# Patient Record
Sex: Female | Born: 1980 | Race: White | Hispanic: No | Marital: Married | Smoking: Current every day smoker
Health system: Southern US, Community
[De-identification: ages and names within clinical notes are randomized; demographics above are authoritative.]

## PROBLEM LIST (undated history)

## (undated) DIAGNOSIS — M25539 Pain in unspecified wrist: Secondary | ICD-10-CM

## (undated) DIAGNOSIS — F419 Anxiety disorder, unspecified: Secondary | ICD-10-CM

## (undated) DIAGNOSIS — E669 Obesity, unspecified: Secondary | ICD-10-CM

## (undated) DIAGNOSIS — I6529 Occlusion and stenosis of unspecified carotid artery: Secondary | ICD-10-CM

## (undated) HISTORY — DX: Pain in unspecified wrist: M25.539

## (undated) HISTORY — PX: TONSILLECTOMY: SUR1361

## (undated) HISTORY — DX: Occlusion and stenosis of unspecified carotid artery: I65.29

## (undated) HISTORY — PX: LAPAROSCOPIC APPENDECTOMY: SUR753

## (undated) HISTORY — PX: KNEE ARTHROSCOPY: SHX127

## (undated) HISTORY — PX: APPENDECTOMY: SHX54

## (undated) HISTORY — PX: KNEE SURGERY: SHX244

## (undated) HISTORY — DX: Obesity, unspecified: E66.9

## (undated) HISTORY — PX: ELBOW SURGERY: SHX618

---
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
## 2006-03-06 HISTORY — PX: FRACTURE SURGERY: SHX138

## 2008-10-08 ENCOUNTER — Emergency Department: Payer: Self-pay | Admitting: Emergency Medicine

## 2009-05-24 ENCOUNTER — Emergency Department: Payer: Self-pay | Admitting: Emergency Medicine

## 2009-06-18 ENCOUNTER — Emergency Department: Payer: Self-pay | Admitting: Unknown Physician Specialty

## 2009-06-22 ENCOUNTER — Ambulatory Visit: Payer: Self-pay | Admitting: Unknown Physician Specialty

## 2009-08-31 ENCOUNTER — Inpatient Hospital Stay: Payer: Self-pay | Admitting: Internal Medicine

## 2009-09-20 ENCOUNTER — Encounter: Payer: Self-pay | Admitting: Obstetrics & Gynecology

## 2009-09-23 ENCOUNTER — Other Ambulatory Visit: Payer: Self-pay | Admitting: Obstetrics & Gynecology

## 2010-06-26 ENCOUNTER — Observation Stay: Payer: Self-pay | Admitting: Obstetrics and Gynecology

## 2010-07-23 ENCOUNTER — Observation Stay: Payer: Self-pay

## 2010-09-22 ENCOUNTER — Observation Stay: Payer: Self-pay | Admitting: Obstetrics and Gynecology

## 2010-10-10 ENCOUNTER — Observation Stay: Payer: Self-pay

## 2010-10-19 ENCOUNTER — Observation Stay: Payer: Self-pay | Admitting: Obstetrics and Gynecology

## 2010-11-04 ENCOUNTER — Observation Stay: Payer: Self-pay | Admitting: Obstetrics and Gynecology

## 2010-11-09 ENCOUNTER — Inpatient Hospital Stay: Payer: Self-pay

## 2011-01-10 ENCOUNTER — Ambulatory Visit: Payer: Self-pay | Admitting: Obstetrics and Gynecology

## 2011-01-13 ENCOUNTER — Ambulatory Visit: Payer: Self-pay | Admitting: Obstetrics and Gynecology

## 2011-01-13 HISTORY — PX: DILATION AND CURETTAGE OF UTERUS: SHX78

## 2012-03-06 HISTORY — PX: CHOLECYSTECTOMY: SHX55

## 2012-08-07 ENCOUNTER — Emergency Department: Payer: Self-pay | Admitting: Emergency Medicine

## 2012-08-07 LAB — CBC WITH DIFFERENTIAL/PLATELET
Basophil #: 0.1 10*3/uL (ref 0.0–0.1)
Basophil %: 0.6 %
Eosinophil #: 0.1 10*3/uL (ref 0.0–0.7)
HGB: 14.2 g/dL (ref 12.0–16.0)
Lymphocyte #: 2.8 10*3/uL (ref 1.0–3.6)
Lymphocyte %: 29.8 %
MCH: 30.1 pg (ref 26.0–34.0)
MCV: 89 fL (ref 80–100)
Monocyte #: 0.7 x10 3/mm (ref 0.2–0.9)
Neutrophil #: 5.8 10*3/uL (ref 1.4–6.5)
Neutrophil %: 61.5 %
Platelet: 256 10*3/uL (ref 150–440)
WBC: 9.5 10*3/uL (ref 3.6–11.0)

## 2012-08-07 LAB — URINALYSIS, COMPLETE
Bilirubin,UR: NEGATIVE
Ketone: NEGATIVE
Nitrite: NEGATIVE
Ph: 6 (ref 4.5–8.0)
Protein: NEGATIVE
RBC,UR: 2 /HPF (ref 0–5)
Specific Gravity: 1.014 (ref 1.003–1.030)
Squamous Epithelial: 9
WBC UR: 15 /HPF (ref 0–5)

## 2012-08-07 LAB — BASIC METABOLIC PANEL
Calcium, Total: 9.4 mg/dL (ref 8.5–10.1)
Chloride: 109 mmol/L — ABNORMAL HIGH (ref 98–107)
Co2: 25 mmol/L (ref 21–32)
Creatinine: 0.58 mg/dL — ABNORMAL LOW (ref 0.60–1.30)
EGFR (Non-African Amer.): >60
Glucose: 90 mg/dL (ref 65–99)
Potassium: 3.6 mmol/L (ref 3.5–5.1)

## 2012-08-10 LAB — BETA STREP CULTURE(ARMC)

## 2012-10-25 ENCOUNTER — Emergency Department: Payer: Self-pay | Admitting: Emergency Medicine

## 2012-10-25 LAB — CBC WITH DIFFERENTIAL/PLATELET
Eosinophil #: 0.1 10*3/uL (ref 0.0–0.7)
Eosinophil %: 1.1 %
HCT: 42 % (ref 35.0–47.0)
Lymphocyte #: 3.7 10*3/uL — ABNORMAL HIGH (ref 1.0–3.6)
MCH: 30.5 pg (ref 26.0–34.0)
MCHC: 34.5 g/dL (ref 32.0–36.0)
MCV: 88 fL (ref 80–100)
Monocyte #: 0.4 x10 3/mm (ref 0.2–0.9)
Monocyte %: 4.1 %
RBC: 4.76 10*6/uL (ref 3.80–5.20)
RDW: 13.2 % (ref 11.5–14.5)
WBC: 10.9 10*3/uL (ref 3.6–11.0)

## 2012-10-25 LAB — COMPREHENSIVE METABOLIC PANEL
Albumin: 3.5 g/dL (ref 3.4–5.0)
Anion Gap: 5 — ABNORMAL LOW (ref 7–16)
Bilirubin,Total: 0.2 mg/dL (ref 0.2–1.0)
Calcium, Total: 9.1 mg/dL (ref 8.5–10.1)
Co2: 26 mmol/L (ref 21–32)
Creatinine: 0.65 mg/dL (ref 0.60–1.30)
EGFR (Non-African Amer.): >60
Glucose: 115 mg/dL — ABNORMAL HIGH (ref 65–99)
Osmolality: 272 (ref 275–301)
Potassium: 3.4 mmol/L — ABNORMAL LOW (ref 3.5–5.1)
SGOT(AST): 22 U/L (ref 15–37)
SGPT (ALT): 26 U/L (ref 12–78)
Sodium: 137 mmol/L (ref 136–145)
Total Protein: 7.6 g/dL (ref 6.4–8.2)

## 2012-10-25 LAB — URINALYSIS, COMPLETE
Bilirubin,UR: NEGATIVE
Ketone: NEGATIVE
Nitrite: NEGATIVE
Protein: NEGATIVE
RBC,UR: 3 /HPF (ref 0–5)
Specific Gravity: 1.019 (ref 1.003–1.030)

## 2012-10-25 LAB — LIPASE, BLOOD: Lipase: 118 U/L (ref 73–393)

## 2012-11-05 ENCOUNTER — Other Ambulatory Visit: Payer: Self-pay | Admitting: Surgery

## 2012-11-05 LAB — HCG, QUANTITATIVE, PREGNANCY: Beta Hcg, Quant.: <1 m[IU]/mL — ABNORMAL LOW

## 2012-11-06 ENCOUNTER — Ambulatory Visit: Payer: Self-pay | Admitting: Surgery

## 2012-11-11 ENCOUNTER — Ambulatory Visit: Payer: Self-pay | Admitting: Surgery

## 2012-11-11 LAB — CBC WITH DIFFERENTIAL/PLATELET
Eosinophil %: 0.7 %
Lymphocyte %: 25.4 %
MCH: 30.1 pg (ref 26.0–34.0)
MCHC: 33.9 g/dL (ref 32.0–36.0)
MCV: 89 fL (ref 80–100)
Neutrophil %: 68.4 %
Platelet: 257 10*3/uL (ref 150–440)
RBC: 4.56 10*6/uL (ref 3.80–5.20)
WBC: 8.4 10*3/uL (ref 3.6–11.0)

## 2012-11-11 LAB — BASIC METABOLIC PANEL
Anion Gap: 11 (ref 7–16)
BUN: 6 mg/dL — ABNORMAL LOW (ref 7–18)
Calcium, Total: 8.7 mg/dL (ref 8.5–10.1)
Creatinine: 0.65 mg/dL (ref 0.60–1.30)
EGFR (African American): >60
EGFR (Non-African Amer.): >60
Glucose: 105 mg/dL — ABNORMAL HIGH (ref 65–99)
Potassium: 3.8 mmol/L (ref 3.5–5.1)
Sodium: 140 mmol/L (ref 136–145)

## 2012-11-13 ENCOUNTER — Ambulatory Visit: Payer: Self-pay | Admitting: Surgery

## 2012-11-13 LAB — HEPATIC FUNCTION PANEL A (ARMC)
Albumin: 3.2 g/dL — ABNORMAL LOW (ref 3.4–5.0)
Alkaline Phosphatase: 108 U/L (ref 50–136)
Bilirubin, Direct: <0.1 mg/dL (ref 0.00–0.20)
SGOT(AST): 23 U/L (ref 15–37)
SGPT (ALT): 23 U/L (ref 12–78)
Total Protein: 7 g/dL (ref 6.4–8.2)

## 2012-12-10 ENCOUNTER — Emergency Department: Payer: Self-pay | Admitting: Emergency Medicine

## 2012-12-10 LAB — CBC
HGB: 13.9 g/dL (ref 12.0–16.0)
MCH: 30.3 pg (ref 26.0–34.0)
MCHC: 34 g/dL (ref 32.0–36.0)
MCV: 89 fL (ref 80–100)
Platelet: 244 10*3/uL (ref 150–440)
WBC: 12 10*3/uL — ABNORMAL HIGH (ref 3.6–11.0)

## 2012-12-10 LAB — COMPREHENSIVE METABOLIC PANEL
Albumin: 3.5 g/dL (ref 3.4–5.0)
Alkaline Phosphatase: 113 U/L (ref 50–136)
Anion Gap: 6 — ABNORMAL LOW (ref 7–16)
Calcium, Total: 8.8 mg/dL (ref 8.5–10.1)
Chloride: 109 mmol/L — ABNORMAL HIGH (ref 98–107)
EGFR (African American): >60
EGFR (Non-African Amer.): >60
SGPT (ALT): 32 U/L (ref 12–78)

## 2012-12-10 LAB — TROPONIN I: Troponin-I: <0.02 ng/mL

## 2012-12-13 ENCOUNTER — Encounter: Payer: Self-pay | Admitting: *Deleted

## 2012-12-16 ENCOUNTER — Encounter (INDEPENDENT_AMBULATORY_CARE_PROVIDER_SITE_OTHER): Payer: Self-pay

## 2012-12-16 ENCOUNTER — Ambulatory Visit (INDEPENDENT_AMBULATORY_CARE_PROVIDER_SITE_OTHER): Payer: BC Managed Care – PPO | Admitting: Cardiovascular Disease

## 2012-12-16 ENCOUNTER — Encounter: Payer: Self-pay | Admitting: Cardiovascular Disease

## 2012-12-16 VITALS — BP 132/90 | HR 92 | Ht 71.0 in | Wt 292.8 lb

## 2012-12-16 DIAGNOSIS — R079 Chest pain, unspecified: Secondary | ICD-10-CM | POA: Insufficient documentation

## 2012-12-16 DIAGNOSIS — E669 Obesity, unspecified: Secondary | ICD-10-CM | POA: Insufficient documentation

## 2012-12-16 DIAGNOSIS — Z8249 Family history of ischemic heart disease and other diseases of the circulatory system: Secondary | ICD-10-CM

## 2012-12-16 MED ORDER — NITROGLYCERIN 0.4 MG SL SUBL: 0.4000 mg | SUBLINGUAL_TABLET | SUBLINGUAL | Status: DC | PRN

## 2012-12-16 NOTE — Patient Instructions (Addendum)
We will schedule you for a treadmill stress test for chest pain Try coke or ginger ale for chest pain sx, also with pepcid and tums  If you have continue symptoms, take omeprazole daily for heart burn  Take nitro SL for severe pain as needed  Please call us if you have new issues that need to be addressed before your next appt.

## 2012-12-16 NOTE — Assessment & Plan Note (Signed)
Prior stress test in 2011 showing no ischemia. Episode of chest pain last week with negative workup in the hospital. No further episodes since that time. We will schedule her for a routine treadmill study to rule out ischemia. Minimal risk factors at this time. She's not a diabetic, nonsmoker (minimal smoking). Benign clinical exam. We have recommended she try carbonation for possible hiatal hernia or esophageal spasm. We've also given her nitroglycerin sublingual to take when necessary for recurrent chest pain symptoms. We have asked her to call our office for recurrent symptoms.

## 2012-12-16 NOTE — Progress Notes (Signed)
Patient ID: Monica Mann, female    DOB: August 19, 1980, 32 y.o.   MRN: [id number]  HPI Comments: [name] is a 32 year old woman with a very light smoking history, no diabetes, strong family history of CAD with mother who had cardiac issues in her 30s who had an episode of chest pain last week on 12/10/2012. She presents for new patient evaluation.  She reports that on October 7, 10:45 in the morning she had acute onset of left-sided chest pain. She was not particularly active at the time, was taking care of her daughter at home. She described it as a "boot stepping on her chest". Symptoms lasted for 15 minutes, were severe.  She was on the phone at the time with her husband. He called 911 .  She went to the hospital and they're EKG was essentially normal, potassium 3.4, negative troponin. She was discharged home with followup today  She does report having severe GERD in the last several days. The morning of her symptoms, she had eaten a banana . He tried to belch at times relieve her symptoms but this did not work she is otherwise active, takes care of 2 children and lead a busy lifestyle. In the hospital c Doppler showed no DVT, chest x-ray was normal  She had her gallbladder removed 11/13/2012 laparoscopically  Prior nuclear stress test in June 2011 that showed no ischemia Prior echocardiogram June 2009 that was essentially normal with normal ejection fraction  EKG shows normal sinus rhythm with rate 92 beats per minute, low voltage throughout     Outpatient Encounter Prescriptions as of 12/16/2012  Medication Sig Dispense Refill  . acetaminophen (TYLENOL) 325 MG tablet Take 650 mg by mouth every 6 (six) hours as needed for pain.      . Multiple Vitamin (MULTIVITAMIN) tablet Take 1 tablet by mouth daily.        Review of Systems  Constitutional: Negative.   HENT: Negative.   Eyes: Negative.   Respiratory: Negative.   Cardiovascular: Positive for chest pain.  Gastrointestinal:  Negative.   Endocrine: Negative.   Musculoskeletal: Negative.   Skin: Negative.   Allergic/Immunologic: Negative.   Neurological: Negative.   Hematological: Negative.   Psychiatric/Behavioral: Negative.   All other systems reviewed and are negative.    BP 132/90  Pulse 92  Ht 5\' 11"  (1.803 m)  Wt 292 lb 12 oz (132.791 kg)  BMI 40.85 kg/m2  Physical Exam  Nursing note and vitals reviewed. Constitutional: She is oriented to person, place, and time. She appears well-developed and well-nourished.  HENT:  Head: Normocephalic.  Nose: Nose normal.  Mouth/Throat: Oropharynx is clear and moist.  Eyes: Conjunctivae are normal. Pupils are equal, round, and reactive to light.  Neck: Normal range of motion. Neck supple. No JVD present.  Cardiovascular: Normal rate, regular rhythm, S1 normal, S2 normal, normal heart sounds and intact distal pulses.  Exam reveals no gallop and no friction rub.   No murmur heard. Pulmonary/Chest: Effort normal and breath sounds normal. No respiratory distress. She has no wheezes. She has no rales. She exhibits no tenderness.  Abdominal: Soft. Bowel sounds are normal. She exhibits no distension. There is no tenderness.  Musculoskeletal: Normal range of motion. She exhibits no edema and no tenderness.  Lymphadenopathy:    She has no cervical adenopathy.  Neurological: She is alert and oriented to person, place, and time. Coordination normal.  Skin: Skin is warm and dry. No rash noted. No erythema.  Psychiatric: She has  a normal mood and affect. Her behavior is normal. Judgment and thought content normal.    Assessment and Plan

## 2012-12-16 NOTE — Assessment & Plan Note (Signed)
Details of her mother's history are unavailable. Patient does not have diabetes, minimal smoking, cholesterol is unavailable

## 2012-12-16 NOTE — Assessment & Plan Note (Signed)
We have encouraged continued exercise, careful diet management in an effort to lose weight. 

## 2012-12-17 ENCOUNTER — Encounter: Payer: BC Managed Care – PPO | Admitting: Cardiovascular Disease

## 2012-12-18 ENCOUNTER — Encounter: Payer: Self-pay | Admitting: Cardiovascular Disease

## 2012-12-18 ENCOUNTER — Ambulatory Visit (INDEPENDENT_AMBULATORY_CARE_PROVIDER_SITE_OTHER): Payer: BC Managed Care – PPO | Admitting: Cardiovascular Disease

## 2012-12-18 VITALS — BP 118/80 | HR 88 | Ht 71.0 in | Wt 291.2 lb

## 2012-12-18 DIAGNOSIS — R079 Chest pain, unspecified: Secondary | ICD-10-CM

## 2012-12-18 NOTE — Patient Instructions (Addendum)
You are doing well. No medication changes were made.  Your stress test was normal.  No further testing needed.  Track your hdeart rate at hpome while at rest Call the office if it runs fast  Please call us if you have new issues that need to be addressed before your next appt.

## 2012-12-18 NOTE — Procedures (Signed)
Exercise Treadmill Test Treadmill ordered for recent epsiodes of chest pain.  Resting EKG shows NSR with rate of 84 bpm, no significant St or T wave changes Resting blood pressure of 118/80. Stand bruce protocal was used.  Patient exercised for 6 min 15 sec,  Peak heart rate of 179 bpm.  This was 95% of the maximum predicted heart rate (target heart rate 100). Achieved 7 METS No symptoms of chest pain or lightheadedness were reported at peak stress or in recovery.  Peak Blood pressure recorded was 162/78 Heart rate at 3 minutes in recovery was 126 bpm. No St changes concerning for ischemia.  FINAL IMPRESSION: Normal exercise stress test. No significant EKG changes concerning for ischemia. Good exercise tolerance. Recent chest pain likely from non-cardiac etiology

## 2012-12-31 ENCOUNTER — Encounter: Payer: Self-pay | Admitting: Family Medicine

## 2013-03-31 ENCOUNTER — Ambulatory Visit: Payer: Self-pay | Admitting: Family Medicine

## 2013-03-31 LAB — HCG, QUANTITATIVE, PREGNANCY: Beta Hcg, Quant.: <1 m[IU]/mL — ABNORMAL LOW

## 2013-09-24 ENCOUNTER — Ambulatory Visit: Payer: Self-pay | Admitting: Nurse Practitioner

## 2013-10-02 ENCOUNTER — Ambulatory Visit: Payer: Self-pay | Admitting: Oncology

## 2014-01-17 ENCOUNTER — Emergency Department: Payer: Self-pay | Admitting: Emergency Medicine

## 2014-06-26 NOTE — Op Note (Signed)
PATIENT NAME:  Monica Mann, Monica Mann MR#:  [id number] DATE OF BIRTH:  05-24-1980  DATE OF PROCEDURE:  11/13/2012  PREOPERATIVE DIAGNOSIS:  Biliary dyskinesia.   POSTOPERATIVE DIAGNOSIS:  Biliary dyskinesia.  PROCEDURE:  Laparoscopic cholecystectomy.   SURGEON:  Phoebe Perch, MD.   ANESTHESIA:  General with endotracheal tube.   INDICATIONS:  This is a patient with recurrent epigastric and right upper quadrant pain, associated fatty food intolerance. On workup, failed to show gallstones but showed biliary dyskinesia with a low ejection fraction and reproduction of her pain on CCK-induced HIDA scan.   Preoperatively, we discussed the rationale for surgery, the options of observation and the potential for this not resolving all of her symptoms and the risks of bleeding, infection, recurrence of symptoms, bile duct damage, bile duct leak, bowel injury, any of which could require further surgery and/or ERCP, stent and papillotomy. This was all reviewed for she and her husband in the preop holding area. They understood and agreed to proceed.   FINDINGS:  Fine granular sludge-like material in the bile, small cystic duct and extensive adhesions to the anterior surface of the gallbladder.   DESCRIPTION OF PROCEDURE:  The patient was induced with general anesthesia, given IV antibiotics. VT prophylaxis was in place. She was prepped and draped in sterile fashion. Marcaine was infiltrated. The skin and subcutaneous tissues around the periumbilical area, an incision was made. Veress needle was placed. Pneumoperitoneum was obtained and a 5 mm trocar port was placed. The abdominal cavity was explored and under direct vision, a 10 mm epigastric port and 2 lateral 5 mm ports were placed. The gallbladder was placed on tension. Extensive adhesions were taken down bluntly without the use of energy. The peritoneum over the infundibulum was incised bluntly and the cystic duct, gallbladder junction was well identified. The  cystic artery was well identified, doubly clipped and divided. This allowed for good visualization of the cystic duct as it entered the infundibulum. Here, it was doubly clipped and divided, and the gallbladder was taken in the gallbladder fossa with electrocautery and passed out through the epigastric port site with the aid of an Endo Catch bag. The area was checked for hemostasis, found to be adequate. It was irrigated with copious amounts of normal saline. There was no sign of bile leak, bowel injury or bleeding. Therefore, the camera was placed in the epigastric site to view back the periumbilical site. There were no sign of adhesions or bowel injury; therefore, pneumoperitoneum was released. All ports were removed. Fascial edges at the epigastric site were approximated with 0 Vicryl. Figure-of-eight sutures, 4-0 subcuticular Monocryl was used at all skin edges. Steri-Strips, Mastisol and sterile dressings were placed.  The patient tolerated the procedure well. There were no complications. She was taken to the recovery room in stable condition to be discharged in the care of her family.   FOLLOWUP:  In 10 days.   ____________________________ Jerrol Banana Burt Knack, MD rec:ce D: 11/13/2012 16:50:11 ET T: 11/13/2012 17:58:20 ET JOB#: [id number]  cc: Jerrol Banana. Burt Knack, MD, <Dictator> Florene Glen MD ELECTRONICALLY SIGNED 11/15/2012 19:13

## 2014-06-26 NOTE — H&P (Signed)
PATIENT NAME:  Monica Mann, Monica Mann MR#:  [id number] DATE OF BIRTH:  16-Jul-1980  DATE OF ADMISSION:  11/11/2012  DATE OF OPERATION: 11/13/2012  CHIEF COMPLAINT: Right upper quadrant pain.   HISTORY OF PRESENT ILLNESS: This is a patient with fairly classic symptoms of gallbladder disease with right upper quadrant pain associated with fatty food intolerance occurring near daily with nausea, occasional emesis, no fevers, no chills. The pain lasts approximately 2 to 3 hours and then recurs after another meal. She has lost 10 pounds. She was seen by Dr. Pat Patrick, who believed that she had gallbladder disease, but there was no objective evidence to confirm; therefore, he did a HIDA scan, which reproduced the patient's symptoms and showed an ejection fraction of 15%. She is here for elective laparoscopic cholecystectomy.   PAST MEDICAL HISTORY: None.   PAST SURGICAL HISTORY: Laparoscopic appendectomy for ruptured appendix.   ALLERGIES: None.   MEDICATIONS: Percocet.   FAMILY HISTORY: Noncontributory.   SOCIAL HISTORY: The patient does not smoke or drink.  REVIEW OF SYSTEMS: 10 system review was performed and negative with the exception of that mentioned in the history of present illness.   PHYSICAL EXAMINATION:  GENERAL: Morbidly obese female patient.  HEENT: No scleral icterus.  NECK: No palpable neck nodes.  CHEST: Clear to auscultation.  CARDIAC: Regular rate and rhythm.  ABDOMEN: Soft. There is tenderness in the right upper quadrant. There is a periumbilical infraumbilical scar.  INTEGUMENT: No jaundice.  NEUROLOGIC: Grossly intact. Calves are nontender.   A HIDA scan showed an ejection fraction of 15%.   LABORATORY VALUES: Personally reviewed showing no sign of choledocholithiasis and a normal hemoglobin and hematocrit.   ASSESSMENT AND PLAN: This is a patient with biliary dyskinesia. She is here for elective laparoscopic cholecystectomy. I have discussed with her the rationale for offering  this surgery, the risks of not resolving all of her symptoms including some of her symptoms that may be reflux-related, but are currently not being resolved by PPIs, the risks of bleeding, infection, conversion to an open procedure, bowel injury, bile duct injury, bile duct leak, any of which could require further surgery and/or ERCP, stent, and papillotomy. This has all been reviewed for her and a prescription for Percocet was given for postoperative pain control.   ____________________________ Jerrol Banana. Burt Knack, MD rec:aw D: 11/11/2012 11:32:50 ET T: 11/11/2012 12:02:17 ET JOB#: [id number]  cc: Jerrol Banana. Burt Knack, MD, <Dictator> Florene Glen MD ELECTRONICALLY SIGNED 11/13/2012 14:37

## 2014-09-05 ENCOUNTER — Emergency Department: Payer: BLUE CROSS/BLUE SHIELD

## 2014-09-05 ENCOUNTER — Emergency Department
Admission: EM | Admit: 2014-09-05 | Discharge: 2014-09-05 | Disposition: A | Discharge status: Home / Self Care | Payer: BLUE CROSS/BLUE SHIELD | Attending: Emergency Medicine | Admitting: Emergency Medicine

## 2014-09-05 DIAGNOSIS — Z79899 Other long term (current) drug therapy: Secondary | ICD-10-CM | POA: Insufficient documentation

## 2014-09-05 DIAGNOSIS — Y998 Other external cause status: Secondary | ICD-10-CM | POA: Diagnosis not present

## 2014-09-05 DIAGNOSIS — Z720 Tobacco use: Secondary | ICD-10-CM | POA: Insufficient documentation

## 2014-09-05 DIAGNOSIS — Y92009 Unspecified place in unspecified non-institutional (private) residence as the place of occurrence of the external cause: Secondary | ICD-10-CM | POA: Diagnosis not present

## 2014-09-05 DIAGNOSIS — S93402A Sprain of unspecified ligament of left ankle, initial encounter: Secondary | ICD-10-CM

## 2014-09-05 DIAGNOSIS — W19XXXA Unspecified fall, initial encounter: Secondary | ICD-10-CM | POA: Diagnosis not present

## 2014-09-05 DIAGNOSIS — Y939 Activity, unspecified: Secondary | ICD-10-CM | POA: Insufficient documentation

## 2014-09-05 DIAGNOSIS — Z880 Allergy status to penicillin: Secondary | ICD-10-CM | POA: Insufficient documentation

## 2014-09-05 DIAGNOSIS — S99912A Unspecified injury of left ankle, initial encounter: Secondary | ICD-10-CM | POA: Diagnosis present

## 2014-09-05 MED ORDER — IBUPROFEN 800 MG PO TABS
ORAL_TABLET | ORAL | Status: AC
  Filled 2014-09-05: qty 1

## 2014-09-05 MED ORDER — TRAMADOL HCL 50 MG PO TABS: 50.0000 mg | ORAL_TABLET | Freq: Four times a day (QID) | ORAL | Status: DC | PRN

## 2014-09-05 MED ORDER — IBUPROFEN 800 MG PO TABS
800.0000 mg | ORAL_TABLET | Freq: Three times a day (TID) | ORAL | Status: DC | PRN
Start: 2014-09-05 — End: 2014-12-11

## 2014-09-05 MED ORDER — TRAMADOL HCL 50 MG PO TABS
50.0000 mg | ORAL_TABLET | Freq: Once | ORAL | Status: AC
  Administered 2014-09-05: 50 mg via ORAL

## 2014-09-05 MED ORDER — IBUPROFEN 800 MG PO TABS
800.0000 mg | ORAL_TABLET | Freq: Once | ORAL | Status: AC
  Administered 2014-09-05: 800 mg via ORAL

## 2014-09-05 MED ORDER — TRAMADOL HCL 50 MG PO TABS
ORAL_TABLET | ORAL | Status: AC
  Filled 2014-09-05: qty 1

## 2014-09-05 NOTE — Discharge Instructions (Signed)
Ankle Sprain  An ankle sprain is an injury to the strong, fibrous tissues (ligaments) that hold your ankle bones together.   HOME CARE   · Put ice on your ankle for 1-2 days or as told by your doctor.  ¨ Put ice in a plastic bag.  ¨ Place a towel between your skin and the bag.  ¨ Leave the ice on for 15-20 minutes at a time, every 2 hours while you are awake.  · Only take medicine as told by your doctor.  · Raise (elevate) your injured ankle above the level of your heart as much as possible for 2-3 days.  · Use crutches if your doctor tells you to. Slowly put your own weight on the affected ankle. Use the crutches until you can walk without pain.  · If you have a plaster splint:  ¨ Do not rest it on anything harder than a pillow for 24 hours.  ¨ Do not put weight on it.  ¨ Do not get it wet.  ¨ Take it off to shower or bathe.  · If given, use an elastic wrap or support stocking for support. Take the wrap off if your toes lose feeling (numb), tingle, or turn cold or blue.  · If you have an air splint:  ¨ Add or let out air to make it comfortable.  ¨ Take it off at night and to shower and bathe.  ¨ Wiggle your toes and move your ankle up and down often while you are wearing it.  GET HELP IF:  · You have rapidly increasing bruising or puffiness (swelling).  · Your toes feel very cold.  · You lose feeling in your foot.  · Your medicine does not help your pain.  GET HELP RIGHT AWAY IF:   · Your toes lose feeling (numb) or turn blue.  · You have severe pain that is increasing.  MAKE SURE YOU:   · Understand these instructions.  · Will watch your condition.  · Will get help right away if you are not doing well or get worse.  Document Released: 08/09/2007 Document Revised: 07/07/2013 Document Reviewed: 09/04/2011  [employer]® Patient Information ©2015 [employer]. This information is not intended to replace advice given to you by your health care provider. Make sure you discuss any questions you have with your health care  provider.

## 2014-09-05 NOTE — ED Notes (Addendum)
Pt reporting a fall lastnight on kitchen floor, heard a loud pop when ankle twisted and hit right shoulder on the floor. Pt applied ice and took Ibuprofen with minimal relief.

## 2014-09-05 NOTE — ED Notes (Signed)
Splint applied by Edison Nasuti. Tech. Circulation, color  WNL.

## 2014-09-05 NOTE — ED Provider Notes (Addendum)
[facility] Emergency Department Provider Note  ____________________________________________  Time seen: Approximately 3:00 PM  I have reviewed the triage vital signs and the nursing notes.   HISTORY  Chief Complaint Ankle Injury    HPI Monica Mann is a 34 y.o. female patient complaining of left ankle and foot pain secondary to a fall last night. He states "occurred at home she heard a loud popping sensation ankle. She states she has been applying ice and take Vicoprofen without any good relief. Patient states she's had pain with ambulation. Patient is rating the pain as a 5/10.   History reviewed. No pertinent past medical history.  Patient Active Problem List   Diagnosis Date Noted  . Chest pain 12/16/2012  . Family history of heart disease 12/16/2012  . Obesity 12/16/2012    Past Surgical History  Procedure Laterality Date  . Laparoscopic appendectomy    . Cholecystectomy  2014  . Knee surgery      left knee  . Elbow surgery      right    Current Outpatient Rx  Name  Route  Sig  Dispense  Refill  . acetaminophen (TYLENOL) 325 MG tablet   Oral   Take 650 mg by mouth every 6 (six) hours as needed for pain.         Marland Kitchen ibuprofen (ADVIL,MOTRIN) 800 MG tablet   Oral   Take 1 tablet (800 mg total) by mouth every 8 (eight) hours as needed for moderate pain.   15 tablet   0   . Multiple Vitamin (MULTIVITAMIN) tablet   Oral   Take 1 tablet by mouth daily.         . nitroGLYCERIN (NITROSTAT) 0.4 MG SL tablet   Sublingual   Place 1 tablet (0.4 mg total) under the tongue every 5 (five) minutes as needed for chest pain.   25 tablet   3   . traMADol (ULTRAM) 50 MG tablet   Oral   Take 1 tablet (50 mg total) by mouth every 6 (six) hours as needed for moderate pain.   12 tablet   0     Allergies Bee venom and Penicillins  Family History  Problem Relation Age of Onset  . Heart disease Mother     stent placement  . Heart  attack Mother 8    MI  . Hypertension Father   . Hyperlipidemia Father     Social History History  Substance Use Topics  . Smoking status: Current Some Day Smoker -- 0.25 packs/day for 5 years    Types: Cigarettes  . Smokeless tobacco: Not on file  . Alcohol Use: No    Review of Systems Constitutional: No fever/chills Eyes: No visual changes. ENT: No sore throat. Cardiovascular: Denies chest pain. Respiratory: Denies shortness of breath. Gastrointestinal: No abdominal pain.  No nausea, no vomiting.  No diarrhea.  No constipation. Genitourinary: Negative for dysuria. Musculoskeletal: Ankle and foot pain.. Skin: Negative for rash. Neurological: Negative for headaches, focal weakness or numbness. Allergic/Immunilogical: Bee stings and penicillin.  10-point ROS otherwise negative.  ____________________________________________   PHYSICAL EXAM:  VITAL SIGNS: ED Triage Vitals  Enc Vitals Group     BP 09/05/14 1255 126/90 mmHg     Pulse Rate 09/05/14 1255 102     Resp --      Temp 09/05/14 1255 98.8 F (37.1 C)     Temp Source 09/05/14 1255 Oral     SpO2 09/05/14 1255 97 %  Weight 09/05/14 1255 240 lb (108.863 kg)     Height 09/05/14 1255 5\' 11"  (1.803 m)     Head Cir --      Peak Flow --      Pain Score 09/05/14 1255 5     Pain Loc --      Pain Edu? --      Excl. in [place]? --     Constitutional: Alert and oriented. Well appearing and in no acute distress. Eyes: Conjunctivae are normal. PERRL. EOMI. Head: Atraumatic. Nose: No congestion/rhinnorhea. Mouth/Throat: Mucous membranes are moist.  Oropharynx non-erythematous. Neck: No stridor. No cervical spine tenderness to palpation Hematological/Lymphatic/Immunilogical: No cervical lymphadenopathy. **}Cardiovascular: Normal rate, regular rhythm. Grossly normal heart sounds.  Good peripheral circulation. Respiratory: Normal respiratory effort.  No retractions. Lungs CTAB. Gastrointestinal: Soft and nontender. No  distention. No abdominal bruits. No CVA tenderness. Musculoskeletal: Right shoulder pain and left foot and ankle pain. Neurologic:  Normal speech and language. No gross focal neurologic deficits are appreciated. Speech is normal. No gait instability. Skin:  Skin is warm, dry and intact. No rash noted. Psychiatric: Mood and affect are normal. Speech and behavior are normal.  ____________________________________________   LABS (all labs ordered are listed, but only abnormal results are displayed)  Labs Reviewed - No data to display ____________________________________________  EKG   ____________________________________________  RADIOLOGY  No acute findings. I, Sable Feil, personally viewed and evaluated these images as part of my medical decision making. I, Sable Feil, personally viewed and evaluated these images as part of my medical decision making.   ____________________________________________   PROCEDURES  Procedure(s) performed: None  Critical Care performed: No  ____________________________________________   INITIAL IMPRESSION / ASSESSMENT AND PLAN / ED COURSE  Pertinent labs & imaging results that were available during my care of the patient were reviewed by me and considered in my medical decision making (see chart for details).  Sprain left ankle and shoulder contusion. Patient placed in a Velcro ankle splint. Given instruction home care for ankle sprain. Patient does follow "clinic as needed. Patient had prescription for tramadol and ibuprofen. ____________________________________________   FINAL CLINICAL IMPRESSION(S) / ED DIAGNOSES  Final diagnoses:  Sprain of left ankle, initial encounter      Sable Feil, PA-C 09/05/14 1556  Orbie Pyo, MD 09/06/14 [place], PA-C 09/07/14 1841  Orbie Pyo, MD 09/11/14 [phone]

## 2014-11-14 ENCOUNTER — Emergency Department (HOSPITAL_BASED_OUTPATIENT_CLINIC_OR_DEPARTMENT_OTHER)
Admission: EM | Admit: 2014-11-14 | Discharge: 2014-11-15 | Disposition: A | Discharge status: Home / Self Care | Payer: BLUE CROSS/BLUE SHIELD | Attending: Emergency Medicine | Admitting: Emergency Medicine

## 2014-11-14 ENCOUNTER — Encounter (HOSPITAL_BASED_OUTPATIENT_CLINIC_OR_DEPARTMENT_OTHER): Payer: Self-pay | Admitting: Emergency Medicine

## 2014-11-14 DIAGNOSIS — R509 Fever, unspecified: Secondary | ICD-10-CM | POA: Diagnosis not present

## 2014-11-14 DIAGNOSIS — R102 Pelvic and perineal pain: Secondary | ICD-10-CM | POA: Diagnosis present

## 2014-11-14 DIAGNOSIS — Z720 Tobacco use: Secondary | ICD-10-CM | POA: Diagnosis not present

## 2014-11-14 DIAGNOSIS — Z3202 Encounter for pregnancy test, result negative: Secondary | ICD-10-CM | POA: Insufficient documentation

## 2014-11-14 DIAGNOSIS — Z880 Allergy status to penicillin: Secondary | ICD-10-CM | POA: Diagnosis not present

## 2014-11-14 LAB — URINALYSIS, ROUTINE W REFLEX MICROSCOPIC
BILIRUBIN URINE: NEGATIVE
Glucose, UA: NEGATIVE mg/dL
Hgb urine dipstick: NEGATIVE
Ketones, ur: NEGATIVE mg/dL
LEUKOCYTES UA: NEGATIVE
NITRITE: NEGATIVE
PH: 5.5 (ref 5.0–8.0)
Protein, ur: NEGATIVE mg/dL
SPECIFIC GRAVITY, URINE: 1.041 — AB (ref 1.005–1.030)
Urobilinogen, UA: 0.2 mg/dL (ref 0.0–1.0)

## 2014-11-14 LAB — PREGNANCY, URINE: PREG TEST UR: NEGATIVE

## 2014-11-14 NOTE — ED Notes (Signed)
Patient states that she has had her period of dark blood x 15 days. The patient reports 4 days of lower pelvic pain

## 2014-11-14 NOTE — ED Provider Notes (Signed)
CSN: [id number]     Arrival date & time 11/14/14  2218 History  This chart was scribed for Monica Wolin, MD by Meriel Pica, ED Scribe. This patient was seen in room MH09/MH09 and the patient's care was started 11:55 PM.   Chief Complaint  Patient presents with  . Pelvic Pain   Patient is a 34 y.o. female presenting with pelvic pain. The history is provided by the patient. No language interpreter was used.  Pelvic Pain This is a new problem. The current episode started more than 2 days ago (4 days). The problem occurs constantly. The problem has been gradually worsening. Pertinent negatives include no chest pain, no headaches and no shortness of breath. Nothing aggravates the symptoms. Nothing relieves the symptoms. She has tried nothing for the symptoms. The treatment provided no relief.   HPI Comments: Monica Mann is a 34 y.o. female who presents to the Emergency Department complaining of worsening, constant, severe, pelvic pain that radiates to bilateral lower back onset 4 days ago. Pt reports irregular, heavy, dark menses every 15 days and thought she was coming on her cycle again as the pain is similar but she has not. Last normal BM this morning. She notes intermittent fevers with last fever being yesterday at 102F. Pt has appointment with OB GYN. Denies dysuria, hematuria, abnormal vaginal discharge, nasal congestion, cough, itching or running eyes.   History reviewed. No pertinent past medical history. Past Surgical History  Procedure Laterality Date  . Laparoscopic appendectomy    . Cholecystectomy  2014  . Knee surgery      left knee  . Elbow surgery      right   Family History  Problem Relation Age of Onset  . Heart disease Mother     stent placement  . Heart attack Mother 46    MI  . Hypertension Father   . Hyperlipidemia Father    Social History  Substance Use Topics  . Smoking status: Current Some Day Smoker -- 0.25 packs/day for 5 years    Types: Cigarettes   . Smokeless tobacco: None  . Alcohol Use: No   OB History    No data available     Review of Systems  Constitutional: Positive for fever.  Eyes: Negative for discharge and itching.  Respiratory: Negative for cough and shortness of breath.   Cardiovascular: Negative for chest pain.  Gastrointestinal: Negative for diarrhea and constipation.  Genitourinary: Positive for pelvic pain. Negative for dysuria, urgency, hematuria, flank pain, decreased urine volume and vaginal discharge.  Neurological: Negative for headaches.  All other systems reviewed and are negative.  Allergies  Bee venom and Penicillins  Home Medications   Prior to Admission medications   Medication Sig Start Date End Date Taking? Authorizing Provider  acetaminophen (TYLENOL) 325 MG tablet Take 650 mg by mouth every 6 (six) hours as needed for pain.    Historical Provider, MD  ibuprofen (ADVIL,MOTRIN) 800 MG tablet Take 1 tablet (800 mg total) by mouth every 8 (eight) hours as needed for moderate pain. 09/05/14   Sable Feil, PA-C  Multiple Vitamin (MULTIVITAMIN) tablet Take 1 tablet by mouth daily.    Historical Provider, MD  nitroGLYCERIN (NITROSTAT) 0.4 MG SL tablet Place 1 tablet (0.4 mg total) under the tongue every 5 (five) minutes as needed for chest pain. 12/16/12   Minna Merritts, MD  traMADol (ULTRAM) 50 MG tablet Take 1 tablet (50 mg total) by mouth every 6 (six) hours as needed for  moderate pain. 09/05/14   Sable Feil, PA-C   BP 146/96 mmHg  Pulse 100  Temp(Src) 98 F (36.7 C) (Oral)  Resp 16  Ht 5\' 11"  (1.803 m)  Wt 254 lb (115.214 kg)  BMI 35.44 kg/m2  SpO2 100%  LMP 11/14/2014 Physical Exam  Constitutional: She is oriented to person, place, and time. She appears well-developed and well-nourished. No distress.  HENT:  Head: Normocephalic.  Mouth/Throat: Oropharynx is clear and moist.  Eyes: Conjunctivae are normal. Pupils are equal, round, and reactive to light. Right eye exhibits no  discharge. Left eye exhibits no discharge. No scleral icterus.  Neck: Normal range of motion. Neck supple. No JVD present.  Cardiovascular: Normal rate, regular rhythm, normal heart sounds and intact distal pulses.   Pulmonary/Chest: Effort normal and breath sounds normal. No respiratory distress. She has no wheezes. She has no rales.  Abdominal: Soft. She exhibits no distension and no mass. Bowel sounds are increased. There is no tenderness. There is no rigidity, no rebound, no guarding, no tenderness at McBurney's point and negative Murphy's sign.  Musculoskeletal: Normal range of motion.  Neurological: She is alert and oriented to person, place, and time. Coordination normal.  Skin: Skin is warm and dry. No rash noted. No erythema. No pallor.  Psychiatric: She has a normal mood and affect. Her behavior is normal.  Nursing note and vitals reviewed.   ED Course  Procedures  DIAGNOSTIC STUDIES: Oxygen Saturation is 100% on RA, normal by my interpretation.    COORDINATION OF CARE: 12:00 AM Discussed treatment plan which includes to order diagnostic labs with pt. Will order CT abdomen pelvis. Pt acknowledges and agrees to plan.   Labs Review Labs Reviewed  URINALYSIS, ROUTINE W REFLEX MICROSCOPIC (NOT AT [facility]) - Abnormal; Notable for the following:    APPearance CLOUDY (*)    Specific Gravity, Urine 1.041 (*)    All other components within normal limits  COMPREHENSIVE METABOLIC PANEL - Abnormal; Notable for the following:    CO2 21 (*)    Glucose, Bld 104 (*)    All other components within normal limits  CBC - Abnormal; Notable for the following:    WBC 19.1 (*)    All other components within normal limits  DIFFERENTIAL - Abnormal; Notable for the following:    Neutro Abs 14.3 (*)    All other components within normal limits  PREGNANCY, URINE  LIPASE, BLOOD    Imaging Review No results found. I have personally reviewed and evaluated these images and lab results as part of my  medical decision-making.   EKG Interpretation None      MDM   Final diagnoses:  None    Results for orders placed or performed during the hospital encounter of 11/14/14  Urinalysis, Routine w reflex microscopic (not at [facility])  Result Value Ref Range   Color, Urine YELLOW YELLOW   APPearance CLOUDY (A) CLEAR   Specific Gravity, Urine 1.041 (H) 1.005 - 1.030   pH 5.5 5.0 - 8.0   Glucose, UA NEGATIVE NEGATIVE mg/dL   Hgb urine dipstick NEGATIVE NEGATIVE   Bilirubin Urine NEGATIVE NEGATIVE   Ketones, ur NEGATIVE NEGATIVE mg/dL   Protein, ur NEGATIVE NEGATIVE mg/dL   Urobilinogen, UA 0.2 0.0 - 1.0 mg/dL   Nitrite NEGATIVE NEGATIVE   Leukocytes, UA NEGATIVE NEGATIVE  Pregnancy, urine  Result Value Ref Range   Preg Test, Ur NEGATIVE NEGATIVE  Lipase, blood  Result Value Ref Range   Lipase 22 22 -  51 U/L  Comprehensive metabolic panel  Result Value Ref Range   Sodium 137 135 - 145 mmol/L   Potassium 3.5 3.5 - 5.1 mmol/L   Chloride 106 101 - 111 mmol/L   CO2 21 (L) 22 - 32 mmol/L   Glucose, Bld 104 (H) 65 - 99 mg/dL   BUN 14 6 - 20 mg/dL   Creatinine, Ser 0.69 0.44 - 1.00 mg/dL   Calcium 9.4 8.9 - 10.3 mg/dL   Total Protein 7.8 6.5 - 8.1 g/dL   Albumin 4.3 3.5 - 5.0 g/dL   AST 22 15 - 41 U/L   ALT 23 14 - 54 U/L   Alkaline Phosphatase 71 38 - 126 U/L   Total Bilirubin 0.3 0.3 - 1.2 mg/dL   GFR calc non Af Amer >60 >60 mL/min   GFR calc Af Amer >60 >60 mL/min   Anion gap 10 5 - 15  CBC  Result Value Ref Range   WBC 19.1 (H) 4.0 - 10.5 K/uL   RBC 4.53 3.87 - 5.11 MIL/uL   Hemoglobin 13.9 12.0 - 15.0 g/dL   HCT 41.1 36.0 - 46.0 %   MCV 90.7 78.0 - 100.0 fL   MCH 30.7 26.0 - 34.0 pg   MCHC 33.8 30.0 - 36.0 g/dL   RDW 13.7 11.5 - 15.5 %   Platelets 265 150 - 400 K/uL  Differential  Result Value Ref Range   Neutrophils Relative % 75 43 - 77 %   Neutro Abs 14.3 (H) 1.7 - 7.7 K/uL   Lymphocytes Relative 19 12 - 46 %   Lymphs Abs 3.7 0.7 - 4.0 K/uL   Monocytes  Relative 5 3 - 12 %   Monocytes Absolute 1.0 0.1 - 1.0 K/uL   Eosinophils Relative 1 0 - 5 %   Eosinophils Absolute 0.1 0.0 - 0.7 K/uL   Basophils Relative 0 0 - 1 %   Basophils Absolute 0.0 0.0 - 0.1 K/uL   Ct Abdomen Pelvis W Contrast  11/15/2014   CLINICAL DATA:  34 year old female with mid abdominal and pelvic pain.  EXAM: CT ABDOMEN AND PELVIS WITH CONTRAST  TECHNIQUE: Multidetector CT imaging of the abdomen and pelvis was performed using the standard protocol following bolus administration of intravenous contrast.  CONTRAST:  148mL OMNIPAQUE IOHEXOL 300 MG/ML SOLN, 96mL OMNIPAQUE IOHEXOL 300 MG/ML SOLN  COMPARISON:  None.  FINDINGS: Minimal bibasilar dependent atelectatic changes. The visualized lung bases are clear. No intra-abdominal free air or free fluid.  Cholecystectomy. The liver, pancreas, spleen, adrenal glands, kidneys, visualized ureters, and urinary bladder appear unremarkable. The uterus is anteverted and appears unremarkable. There is a 3 cm dominant left ovarian follicle/cyst. Ultrasound may provide better evaluation of the pelvic structures.  Moderate stool throughout the colon no evidence of bowel obstruction or inflammation. Appendectomy.  The abdominal aorta and IVC appear unremarkable. No portal venous gas identified. The visualized osseous structures are unremarkable. No acute fracture.  IMPRESSION: A 3 cm dominant left ovarian follicle/ cyst. No other acute intra-abdominal or pelvic pathology identified.   Electronically Signed   By: Anner Crete M.D.   On: 11/15/2014 02:06    Medications  ondansetron (ZOFRAN) injection 4 mg (4 mg Intravenous Given 11/15/14 0049)  sodium chloride 0.9 % bolus 1,000 mL (0 mLs Intravenous Stopped 11/15/14 0312)  morphine 4 MG/ML injection 4 mg (4 mg Intravenous Given 11/15/14 0049)  iohexol (OMNIPAQUE) 300 MG/ML solution 25 mL (25 mLs Oral Contrast Given 11/15/14 0130)  iohexol (OMNIPAQUE)  300 MG/ML solution 100 mL (100 mLs Intravenous  Contrast Given 11/15/14 0130)  ketorolac (TORADOL) 30 MG/ML injection 30 mg (30 mg Intravenous Given 11/15/14 0242)   No acute findings in the abdomen or pelvis on CT scan.   Will treat wit Mobic and have patient follow up with GYN    I personally performed the services described in this documentation, which was scribed in my presence. The recorded information has been reviewed and is accurate.     Veatrice Kells, MD 11/15/14 [phone]

## 2014-11-15 ENCOUNTER — Encounter (HOSPITAL_BASED_OUTPATIENT_CLINIC_OR_DEPARTMENT_OTHER): Payer: Self-pay | Admitting: Emergency Medicine

## 2014-11-15 ENCOUNTER — Emergency Department (HOSPITAL_BASED_OUTPATIENT_CLINIC_OR_DEPARTMENT_OTHER): Payer: BLUE CROSS/BLUE SHIELD

## 2014-11-15 LAB — CBC
HCT: 41.1 % (ref 36.0–46.0)
HEMOGLOBIN: 13.9 g/dL (ref 12.0–15.0)
MCH: 30.7 pg (ref 26.0–34.0)
MCHC: 33.8 g/dL (ref 30.0–36.0)
MCV: 90.7 fL (ref 78.0–100.0)
Platelets: 265 10*3/uL (ref 150–400)
RBC: 4.53 MIL/uL (ref 3.87–5.11)
RDW: 13.7 % (ref 11.5–15.5)
WBC: 19.1 10*3/uL — ABNORMAL HIGH (ref 4.0–10.5)

## 2014-11-15 LAB — DIFFERENTIAL
BASOS ABS: 0 10*3/uL (ref 0.0–0.1)
BASOS PCT: 0 % (ref 0–1)
Eosinophils Absolute: 0.1 10*3/uL (ref 0.0–0.7)
Eosinophils Relative: 1 % (ref 0–5)
LYMPHS ABS: 3.7 10*3/uL (ref 0.7–4.0)
Lymphocytes Relative: 19 % (ref 12–46)
Monocytes Absolute: 1 10*3/uL (ref 0.1–1.0)
Monocytes Relative: 5 % (ref 3–12)
Neutro Abs: 14.3 10*3/uL — ABNORMAL HIGH (ref 1.7–7.7)
Neutrophils Relative %: 75 % (ref 43–77)

## 2014-11-15 LAB — COMPREHENSIVE METABOLIC PANEL
ALBUMIN: 4.3 g/dL (ref 3.5–5.0)
ALT: 23 U/L (ref 14–54)
ANION GAP: 10 (ref 5–15)
AST: 22 U/L (ref 15–41)
Alkaline Phosphatase: 71 U/L (ref 38–126)
BILIRUBIN TOTAL: 0.3 mg/dL (ref 0.3–1.2)
BUN: 14 mg/dL (ref 6–20)
CALCIUM: 9.4 mg/dL (ref 8.9–10.3)
CO2: 21 mmol/L — ABNORMAL LOW (ref 22–32)
Chloride: 106 mmol/L (ref 101–111)
Creatinine, Ser: 0.69 mg/dL (ref 0.44–1.00)
GFR calc non Af Amer: >60 mL/min (ref >60)
GLUCOSE: 104 mg/dL — AB (ref 65–99)
Potassium: 3.5 mmol/L (ref 3.5–5.1)
Sodium: 137 mmol/L (ref 135–145)
Total Protein: 7.8 g/dL (ref 6.5–8.1)

## 2014-11-15 LAB — LIPASE, BLOOD: Lipase: 22 U/L (ref 22–51)

## 2014-11-15 MED ORDER — ONDANSETRON HCL 4 MG/2ML IJ SOLN
4.0000 mg | Freq: Once | INTRAMUSCULAR | Status: AC
  Administered 2014-11-15: 4 mg via INTRAVENOUS
  Filled 2014-11-15: qty 2

## 2014-11-15 MED ORDER — IOHEXOL 300 MG/ML  SOLN
25.0000 mL | Freq: Once | INTRAMUSCULAR | Status: AC | PRN
Start: 2014-11-15 — End: 2014-11-15
  Administered 2014-11-15: 25 mL via ORAL

## 2014-11-15 MED ORDER — SODIUM CHLORIDE 0.9 % IV BOLUS (SEPSIS)
1000.0000 mL | Freq: Once | INTRAVENOUS | Status: AC
  Administered 2014-11-15: 1000 mL via INTRAVENOUS

## 2014-11-15 MED ORDER — KETOROLAC TROMETHAMINE 30 MG/ML IJ SOLN
30.0000 mg | Freq: Once | INTRAMUSCULAR | Status: AC
  Administered 2014-11-15: 30 mg via INTRAVENOUS
  Filled 2014-11-15: qty 1

## 2014-11-15 MED ORDER — MORPHINE SULFATE (PF) 4 MG/ML IV SOLN
4.0000 mg | Freq: Once | INTRAVENOUS | Status: AC
  Administered 2014-11-15: 4 mg via INTRAVENOUS
  Filled 2014-11-15: qty 1

## 2014-11-15 MED ORDER — IOHEXOL 300 MG/ML  SOLN
100.0000 mL | Freq: Once | INTRAMUSCULAR | Status: AC | PRN
  Administered 2014-11-15: 100 mL via INTRAVENOUS

## 2014-11-15 MED ORDER — MELOXICAM 15 MG PO TABS: 15.0000 mg | ORAL_TABLET | Freq: Every day | ORAL | Status: DC

## 2014-12-08 ENCOUNTER — Inpatient Hospital Stay
Admission: AD | Admit: 2014-12-08 | Discharge: 2014-12-11 | DRG: 759 | Disposition: A | Discharge status: Home / Self Care | Payer: BLUE CROSS/BLUE SHIELD | Source: Ambulatory Visit | Attending: Obstetrics & Gynecology | Admitting: Obstetrics & Gynecology

## 2014-12-08 DIAGNOSIS — Z91030 Bee allergy status: Secondary | ICD-10-CM | POA: Diagnosis not present

## 2014-12-08 DIAGNOSIS — N739 Female pelvic inflammatory disease, unspecified: Secondary | ICD-10-CM | POA: Diagnosis present

## 2014-12-08 DIAGNOSIS — K5900 Constipation, unspecified: Secondary | ICD-10-CM | POA: Diagnosis present

## 2014-12-08 DIAGNOSIS — Z880 Allergy status to penicillin: Secondary | ICD-10-CM | POA: Diagnosis not present

## 2014-12-08 DIAGNOSIS — R102 Pelvic and perineal pain: Secondary | ICD-10-CM

## 2014-12-08 LAB — COMPREHENSIVE METABOLIC PANEL
ALBUMIN: 4 g/dL (ref 3.5–5.0)
ALT: 38 U/L (ref 14–54)
ANION GAP: 6 (ref 5–15)
AST: 28 U/L (ref 15–41)
Alkaline Phosphatase: 66 U/L (ref 38–126)
BUN: 7 mg/dL (ref 6–20)
CO2: 24 mmol/L (ref 22–32)
Calcium: 8.9 mg/dL (ref 8.9–10.3)
Chloride: 110 mmol/L (ref 101–111)
Creatinine, Ser: 0.61 mg/dL (ref 0.44–1.00)
GFR calc Af Amer: >60 mL/min (ref >60)
GFR calc non Af Amer: >60 mL/min (ref >60)
GLUCOSE: 87 mg/dL (ref 65–99)
POTASSIUM: 3.7 mmol/L (ref 3.5–5.1)
SODIUM: 140 mmol/L (ref 135–145)
Total Bilirubin: 0.5 mg/dL (ref 0.3–1.2)
Total Protein: 7.1 g/dL (ref 6.5–8.1)

## 2014-12-08 LAB — CBC WITH DIFFERENTIAL/PLATELET
BASOS ABS: 0.1 10*3/uL (ref 0–0.1)
Basophils Relative: 1 %
Eosinophils Absolute: 0.2 10*3/uL (ref 0–0.7)
Eosinophils Relative: 1 %
HEMATOCRIT: 37.5 % (ref 35.0–47.0)
HEMOGLOBIN: 12.4 g/dL (ref 12.0–16.0)
LYMPHS PCT: 43 %
Lymphs Abs: 5 10*3/uL — ABNORMAL HIGH (ref 1.0–3.6)
MCH: 30.4 pg (ref 26.0–34.0)
MCHC: 33.1 g/dL (ref 32.0–36.0)
MCV: 91.6 fL (ref 80.0–100.0)
MONO ABS: 0.5 10*3/uL (ref 0.2–0.9)
MONOS PCT: 4 %
NEUTROS ABS: 5.9 10*3/uL (ref 1.4–6.5)
NEUTROS PCT: 51 %
Platelets: 226 10*3/uL (ref 150–440)
RBC: 4.09 MIL/uL (ref 3.80–5.20)
RDW: 13.1 % (ref 11.5–14.5)
WBC: 11.6 10*3/uL — ABNORMAL HIGH (ref 3.6–11.0)

## 2014-12-08 MED ORDER — GENTAMICIN SULFATE 40 MG/ML IJ SOLN
5.0000 mg/kg/d | INTRAVENOUS | Status: DC
  Administered 2014-12-08: 590 mg via INTRAVENOUS
  Filled 2014-12-08 (×3): qty 14.75

## 2014-12-08 MED ORDER — SIMETHICONE 80 MG PO CHEW: 80.0000 mg | CHEWABLE_TABLET | Freq: Four times a day (QID) | ORAL | Status: DC | PRN

## 2014-12-08 MED ORDER — CLINDAMYCIN PHOSPHATE 900 MG/50ML IV SOLN
900.0000 mg | Freq: Three times a day (TID) | INTRAVENOUS | Status: DC
  Administered 2014-12-08 – 2014-12-11 (×9): 900 mg via INTRAVENOUS
  Filled 2014-12-08 (×13): qty 50

## 2014-12-08 MED ORDER — LACTATED RINGERS IV SOLN
INTRAVENOUS | Status: DC
  Administered 2014-12-08 – 2014-12-11 (×6): via INTRAVENOUS

## 2014-12-08 MED ORDER — ONDANSETRON HCL 4 MG/2ML IJ SOLN
4.0000 mg | Freq: Four times a day (QID) | INTRAMUSCULAR | Status: DC | PRN
Start: 2014-12-08 — End: 2014-12-11

## 2014-12-08 MED ORDER — ACETAMINOPHEN 325 MG PO TABS: 650.0000 mg | ORAL_TABLET | ORAL | Status: DC | PRN

## 2014-12-08 MED ORDER — HYDROMORPHONE HCL 1 MG/ML IJ SOLN
0.2000 mg | INTRAMUSCULAR | Status: DC | PRN
  Administered 2014-12-08 – 2014-12-09 (×5): 0.6 mg via INTRAVENOUS
  Filled 2014-12-08 (×5): qty 1

## 2014-12-08 MED ORDER — ONDANSETRON HCL 4 MG PO TABS: 4.0000 mg | ORAL_TABLET | Freq: Four times a day (QID) | ORAL | Status: DC | PRN

## 2014-12-08 MED ORDER — HYDROCODONE-ACETAMINOPHEN 5-325 MG PO TABS
1.0000 | ORAL_TABLET | ORAL | Status: DC | PRN
  Administered 2014-12-08 – 2014-12-09 (×2): 2 via ORAL
  Administered 2014-12-09: 1 via ORAL
  Administered 2014-12-09 (×3): 2 via ORAL
  Filled 2014-12-08 (×6): qty 2

## 2014-12-08 NOTE — H&P (Addendum)
Obstetrics and Gynecology History and Physical   SERVICE: Gynecology   Patient Name: Monica Mann Patient MRN:   [id number]  CC: fever, pelvic pain  HPI: Monica Mann is a 34 y.o. G2P1011 who has pelvic inflammatory disease, with failed outpatient treatment, who has had worsening symptoms after initial treatment of PO azithromycin and metronidazole.  She is being directly admitted to the hospital for IV antibiotics.  Her story is unfortunate:  She was seen in the ED on 9/10 and although presented with fever of 102 and complaints of sudden onset, severe pelvic pain, and was found to have a WBC of 19,  she was discharged with a diagnosis of constipation and no antibiotics.  She presented three days later as an outpatient to me and on exam had extreme cervical motion tenderness, adnexal tenderness, and her complaints of pain were still present; her fever had resolved.  I was able to review her chart but not the images at the time of her visit and presumptively diagnosed her with pelvic inflammatory disease.  Due to the resolution of her fever, I wanted to try to treat her as an outpatient.  For this, I prescribed 2-weeks of azithromycin 250mg  PO daily with a 1000mg  loading dose, and 500mg  metronidazole BID. Due to difficulty with her insurance company, the patient was able to get some, but not all of the prescribed course of azithromycin.  She returned for another exam on 9/28 and was significantly improved symptomatically but still had CMT and uterine and adnexal tenderness.  I was able to review her CT images and due to the heterogenicity of the uterus and apparent dilation of the fallopian tubes bilaterally, my clinical opinion remained that of PID.  I then recommended a longer course of antibiotics, for 4-6 weeks of total treatment.  Due to the issue with prior authorization with her insurance company for the azithromycin, I preemptively called them to ask for urgent review of this medication for  30 day supply.  Again she was not able to fill a prescription for Azithromycin until prior authorization was approved. She continued taking the metronidazole while we waited for authorization.  During this time her symptoms became worse, and she again became febrile with weakness and worsening pain, with purulent discharge from her vagina.  Upon learning this, I asked her to report to the hospital for inpatient treatment.     Review of Systems: positives in bold GEN:   fevers, chills, weight changes, appetite changes, fatigue, night sweats HEENT:  HA, vision changes, hearing loss, congestion, rhinorrhea, sinus pressure, dysphagia CV:   CP, palpitations PULM:  SOB, cough GI:  abd pain (low/pelvis), N/V/D/C GU:  dysuria, urgency, frequency MSK:  arthralgias, myalgias, back pain, swelling SKIN:  rashes, color changes, pallor NEURO:  numbness, weakness, tingling, seizures, dizziness, tremors PSYCH:  depression, anxiety, behavioral problems, confusion  HEME/LYMPH:  easy bruising or bleeding ENDO:  heat/cold intolerance  Past Obstetrical History: OB History    No data available      Past Gynecologic History: Patient's last menstrual period was 11/14/2014. GC/CT from office 9/13 was negative.  Past Medical History: History reviewed. No pertinent past medical history.  Past Surgical History:   Past Surgical History  Procedure Laterality Date  . Laparoscopic appendectomy    . Cholecystectomy  2014  . Knee surgery      left knee  . Elbow surgery      right    Family History:  family history includes Heart attack (age of  onset: 24) in her mother; Heart disease in her mother; Hyperlipidemia in her father; Hypertension in her father.  Social History:  Social History   Social History  . Marital Status: Married    Spouse Name: N/A  . Number of Children: N/A  . Years of Education: N/A   Occupational History  . Not on file.   Social History Main Topics  . Smoking status:  Current Some Day Smoker -- 0.25 packs/day for 5 years    Types: Cigarettes  . Smokeless tobacco: Not on file  . Alcohol Use: No  . Drug Use: No  . Sexual Activity: Yes    Birth Control/ Protection: Implant   Other Topics Concern  . Not on file   Social History Narrative    Home Medications:  Medications reconciled in EPIC  No current facility-administered medications on file prior to encounter.   Current Outpatient Prescriptions on File Prior to Encounter  Medication Sig Dispense Refill  . acetaminophen (TYLENOL) 325 MG tablet Take 650 mg by mouth every 6 (six) hours as needed for pain.    Marland Kitchen ibuprofen (ADVIL,MOTRIN) 800 MG tablet Take 1 tablet (800 mg total) by mouth every 8 (eight) hours as needed for moderate pain. 15 tablet 0  . meloxicam (MOBIC) 15 MG tablet Take 1 tablet (15 mg total) by mouth daily. 10 tablet 0  . Multiple Vitamin (MULTIVITAMIN) tablet Take 1 tablet by mouth daily.    . nitroGLYCERIN (NITROSTAT) 0.4 MG SL tablet Place 1 tablet (0.4 mg total) under the tongue every 5 (five) minutes as needed for chest pain. 25 tablet 3  . traMADol (ULTRAM) 50 MG tablet Take 1 tablet (50 mg total) by mouth every 6 (six) hours as needed for moderate pain. 12 tablet 0    Allergies:  Allergies  Allergen Reactions  . Bee Venom Anaphylaxis  . Penicillins Hives    Physical Exam:  Vitals:  98.3 F, P 87, RR 22 , BP 128/88  General Appearance:  Well developed, well nourished, no acute distress, alert and oriented x3 HEENT:  Normocephalic atraumatic, extraocular movements intact, moist mucous membranes Cardiovascular:  Normal S1/S2, regular rate and rhythm, no murmurs Pulmonary:  clear to auscultation, no wheezes, rales or rhonchi, symmetric air entry, good air exchange Abdomen:  Bowel sounds present, soft, nontender, nondistended, no abnormal masses, no epigastric pain Extremities:  Full range of motion, no pedal edema, 2+ distal pulses, no tenderness Skin:  normal coloration  and turgor, no rashes, no suspicious skin lesions noted  Neurologic:  Cranial nerves 2-12 grossly intact Psychiatric:  Normal mood and affect, appropriate, no AH/VH Pelvic:  NEFG, no vulvar masses or lesions, normal vaginal mucosa, +CMT, no evidence of discharge from cervix, +uterine tenderness and bilateral adnexal tenderness   Labs/Studies:    CBC and Coags:  Lab Results  Component Value Date   WBC 11.6* 12/08/2014   NEUTOPHILPCT 51 12/08/2014   EOSPCT 1 12/08/2014   BASOPCT 1 12/08/2014   LYMPHOPCT 43 12/08/2014   HGB 12.4 12/08/2014   HCT 37.5 12/08/2014   MCV 91.6 12/08/2014   PLT 226 12/08/2014   CMP:  Lab Results  Component Value Date   NA 140 12/08/2014   K 3.7 12/08/2014   CL 110 12/08/2014   CO2 24 12/08/2014   BUN 7 12/08/2014   CREATININE 0.61 12/08/2014   CREATININE 0.69 11/14/2014   CREATININE 0.76 12/10/2012   PROT 7.1 12/08/2014   BILITOT 0.5 12/08/2014   ALT 38 12/08/2014  AST 28 12/08/2014   ALKPHOS 66 12/08/2014    Other Imaging: Ct Abdomen Pelvis W Contrast  11/15/2014   CLINICAL DATA:  34 year old female with mid abdominal and pelvic pain.  EXAM: CT ABDOMEN AND PELVIS WITH CONTRAST  TECHNIQUE: Multidetector CT imaging of the abdomen and pelvis was performed using the standard protocol following bolus administration of intravenous contrast.  CONTRAST:  168mL OMNIPAQUE IOHEXOL 300 MG/ML SOLN, 60mL OMNIPAQUE IOHEXOL 300 MG/ML SOLN  COMPARISON:  None.  FINDINGS: Minimal bibasilar dependent atelectatic changes. The visualized lung bases are clear. No intra-abdominal free air or free fluid.  Cholecystectomy. The liver, pancreas, spleen, adrenal glands, kidneys, visualized ureters, and urinary bladder appear unremarkable. The uterus is anteverted and appears unremarkable. There is a 3 cm dominant left ovarian follicle/cyst. Ultrasound may provide better evaluation of the pelvic structures.  Moderate stool throughout the colon no evidence of bowel obstruction or  inflammation. Appendectomy.  The abdominal aorta and IVC appear unremarkable. No portal venous gas identified. The visualized osseous structures are unremarkable. No acute fracture.  IMPRESSION: A 3 cm dominant left ovarian follicle/ cyst. No other acute intra-abdominal or pelvic pathology identified.   Electronically Signed   By: Anner Crete M.D.   On: 11/15/2014 02:06     Assessment / Plan:   Adaley Ellerbrock is a 34 y.o.  With pelvic inflammatory disease with delayed treatment and poor response to outpatient regimen.  1. Admit to inpatient as will requires >48 hours stay over 2 midnights or more. 2. Administer IV antibiotics - due to penicillin allergy, will give CDC recommended parental therapy of Clindamycin/Gentamicin.  Clinda: 900mg  q8hrs, Gent: 5mg /kg q24hrs.  Continue IV abx at least 24 hours and then per clinical improvement consider PO abx. Patient will need weeks of abx treatment after discharge as well. 3. IV fluids: LR @ 125cc/hr (1cc per kg) as patient is not septic or meeting SIRS criteria at this moment.  Should this change, increase in rate of administration will be reevaluated.   4. Diet: regular, no restrictions 5. Labs: CBC, CMP, blood cultures x2 on admission. 6. DVT ppx:  SCDs while in bed, as patient is ambulatory she is low VTE risk.  Encourage OOB 7. Ultrasound may be of benefit, will consider ordering this during her inpatient stay. 8. Pain control PRN  ----- Larey Days, MD Attending Obstetrician and Gynecologist [facility]

## 2014-12-09 MED ORDER — HYDROCODONE-ACETAMINOPHEN 5-325 MG PO TABS
1.0000 | ORAL_TABLET | ORAL | Status: DC | PRN
  Administered 2014-12-10 – 2014-12-11 (×3): 1 via ORAL
  Filled 2014-12-09 (×2): qty 1

## 2014-12-09 MED ORDER — GENTAMICIN SULFATE 40 MG/ML IJ SOLN
5.0000 mg/kg | INTRAVENOUS | Status: DC
  Administered 2014-12-09 – 2014-12-10 (×2): 590 mg via INTRAVENOUS
  Filled 2014-12-09 (×4): qty 14.75

## 2014-12-09 MED ORDER — HYDROMORPHONE HCL 1 MG/ML IJ SOLN
0.2000 mg | INTRAMUSCULAR | Status: DC | PRN
  Administered 2014-12-10: 0.2 mg via INTRAVENOUS
  Filled 2014-12-09: qty 1

## 2014-12-09 MED ORDER — HYDROCODONE-ACETAMINOPHEN 5-325 MG PO TABS
2.0000 | ORAL_TABLET | ORAL | Status: DC | PRN
  Administered 2014-12-09 – 2014-12-10 (×4): 2 via ORAL
  Filled 2014-12-09 (×6): qty 2

## 2014-12-09 NOTE — H&P (Addendum)
Obstetric and Gynecology  HD # 2  Subjective  Patient doing well, complaints of continued pain but tolerating with PO and IV meds, ambulating without difficulty, voiding spontaneously.     Denies CP, SOB, F/C, N/V/D, or leg pain.   Objective  Objective:   Filed Vitals:   12/09/14 0751     BP: 135/79     Pulse: 75     Temp: 98.7 F (37.1 C)     TempSrc:      Resp: 20     Height: 5\' 11"  (1.803 m)     Weight: 119.296 kg (263 lb)     SpO2:         General: NAD Cardiovascular: RRR, no murmurs Pulmonary: CTAB, normal respiratory effort Abdomen: Benign. Non-tender, +BS, no guarding. Pelvis:  Continued TTP in low pelvis bilaterally, +CMT, +uterine and adnexal ttp Extremities: No erythema or cords, no calf tenderness, with normal peripheral pulses.  Labs: Results for orders placed or performed during the hospital encounter of 12/08/14 (from the past 24 hour(s))  CBC with Differential/Platelet     Status: Abnormal   Collection Time: 12/08/14 11:17 PM  Result Value Ref Range   WBC 11.6 (H) 3.6 - 11.0 K/uL   RBC 4.09 3.80 - 5.20 MIL/uL   Hemoglobin 12.4 12.0 - 16.0 g/dL   HCT 37.5 35.0 - 47.0 %   MCV 91.6 80.0 - 100.0 fL   MCH 30.4 26.0 - 34.0 pg   MCHC 33.1 32.0 - 36.0 g/dL   RDW 13.1 11.5 - 14.5 %   Platelets 226 150 - 440 K/uL   Neutrophils Relative % 51 %   Neutro Abs 5.9 1.4 - 6.5 K/uL   Lymphocytes Relative 43 %   Lymphs Abs 5.0 (H) 1.0 - 3.6 K/uL   Monocytes Relative 4 %   Monocytes Absolute 0.5 0.2 - 0.9 K/uL   Eosinophils Relative 1 %   Eosinophils Absolute 0.2 0 - 0.7 K/uL   Basophils Relative 1 %   Basophils Absolute 0.1 0 - 0.1 K/uL    Cultures: Results for orders placed or performed during the hospital encounter of 12/08/14  Culture, blood (routine x 2)     Status: None (Preliminary result)   Collection Time: 12/08/14  9:39 PM  Result Value Ref Range Status   Specimen Description BLOOD LEFT ANTECUBITAL  Final   Special Requests   Final    BOTTLES  DRAWN AEROBIC AND ANAEROBIC 5MLANAEROBIC, 10MLAEROBIC   Culture NO GROWTH < 12 HOURS  Final   Report Status PENDING  Incomplete  Culture, blood (routine x 2)     Status: None (Preliminary result)   Collection Time: 12/08/14  9:46 PM  Result Value Ref Range Status   Specimen Description BLOOD LEFT ANTECUBITAL  Final   Special Requests BOTTLES DRAWN AEROBIC AND ANAEROBIC 5ML  Final   Culture NO GROWTH < 12 HOURS  Final   Report Status PENDING  Incomplete       Assessment   34 y.o.  Hospital Day: 2 with Pelvic Inflammatory Disease  Plan   1. Continue IV antibiotics 2. Pain control PRN 3. Pelvic ultrasound to be ordered today, pending 4. If no improvement of pain in 48 hours total of ABX will consider other alternatives to treating her pain,  Results of imaging may guide [place] in this matter.  Patient has been advised if no significant resolution, surgery may be her final option.  She is amenable to this plan.  ----- Larey Days,  MD Attending Obstetrician and Gynecologist [facility]   Addendum: pelvic ultrasound unable to be performed today; will do in the AM or overnight.

## 2014-12-10 ENCOUNTER — Inpatient Hospital Stay: Payer: BLUE CROSS/BLUE SHIELD

## 2014-12-10 LAB — HIV ANTIBODY (ROUTINE TESTING W REFLEX): HIV Screen 4th Generation wRfx: NONREACTIVE

## 2014-12-10 NOTE — Progress Notes (Signed)
Daily Benign Gynecology Progress Note [name]  [id number]  HD#3  Subjective:  Overnight Events: None Complaints: Feeling better today. IF pain was 8/10 yesterday, is 3/10 today.  She denies: fevers, chills, chest pain, trouble breathing, nausea, vomiting, severe abdominal pain.  She has tolerated: normal diet as tolerated She reports her pain is well controlled with infrequent doses of Norco.   She is ambulating and is voiding.  Objective:  Most recent vitals Temp: 98 F (36.7 C)  BP: (!) 115/50 mmHg  Pulse Rate: 81  Resp: 18  SpO2: 97 %   Vitals Range over 24 hours BP  Min: 108/66  Max: 140/88 Temp  Avg: 98.4 F (36.9 C)  Min: 98 F (36.7 C)  Max: 98.7 F (37.1 C) Pulse  Avg: 71.6  Min: 65  Max: 81   Urine Output: not completely recorded  Physical Exam General: alert, well appearing, and in no distress Heart: regular rate and rhythm, no murmurs Lungs: clear to auscultation, no wheezes, rales or rhonchi, symmetric air entry Abdomen: abdomen is soft without significant tenderness (mild lower abdominal tenderness), masses, organomegaly or guarding. Extremities: no redness or tenderness in the calves or thighs, no edema  AM Labs Lab Results  Component Value Date   WBC 11.6* 12/08/2014   HGB 12.4 12/08/2014   HCT 37.5 12/08/2014   PLT 226 12/08/2014   NA 140 12/08/2014   K 3.7 12/08/2014   CREATININE 0.61 12/08/2014   BUN 7 12/08/2014    Pelvic Ultrasound Report 12/10/14: FINDINGS: Uterus  Measurements: 9.2 x 4.0 x 6.4 cm. No fibroids or other mass visualized.  Endometrium  Thickness: 10 mm. No focal abnormality visualized.  Right ovary  Measurements: 3.5 x 1.9 x 2.2 cm. Normal appearance/no adnexal mass.  Left ovary  Measurements: 5.1 x 2.9 x 3.6 cm. Within the left ovary there is a 2.8 x 2.9 x 2.9 cm cystic lesion with a few thin internal septations.  Other findings  None  IMPRESSION: Complicated cystic lesion within the left  ovary which may represent a cystic lesion with multiple thin internal septations or possibly adjacent prominent follicles. Recommend initial short-term follow-up with pelvic ultrasound in 6- 8 weeks to assess for interval change/ Resolution  Assessment:  Monica Mann is a 34 y.o. female HD#3 admitted with pelvic inflammatory disease, improving  Plan:  Pain Control:  Continue Norco prn Pulmonary:  Low risk. No incentive spirometry necessary as she is ambulating  Heme: stable ID: continue gent/clinda until 24 hours without symptoms, then discharge home on doxy/flagyl for total of 14 days (or per admitting provider who has already developed a plan with the patient). GI: n/a GU:  Voiding well spontaneously. No issues Nutrition:  Regular diet IVF: saline lock DVT Prophylaxis:  SCDs and ambulation Activity: as tolerated Anticipated Discharge: 1-2 days  Will Bonnet, MD  12/10/2014 7:15 PM

## 2014-12-11 MED ORDER — METRONIDAZOLE 500 MG PO TABS: 500.0000 mg | ORAL_TABLET | Freq: Two times a day (BID) | ORAL | Status: DC

## 2014-12-11 MED ORDER — AZITHROMYCIN 250 MG PO TABS: ORAL_TABLET | ORAL | Status: DC

## 2014-12-11 MED ORDER — IBUPROFEN 800 MG PO TABS: 800.0000 mg | ORAL_TABLET | Freq: Three times a day (TID) | ORAL | Status: DC | PRN

## 2014-12-11 MED ORDER — HYDROCODONE-ACETAMINOPHEN 5-325 MG PO TABS: 1.0000 | ORAL_TABLET | ORAL | Status: DC | PRN

## 2014-12-11 MED ORDER — HYDROCODONE-ACETAMINOPHEN 5-325 MG PO TABS
1.0000 | ORAL_TABLET | Freq: Once | ORAL | Status: AC
  Administered 2014-12-11: 1 via ORAL

## 2014-12-11 NOTE — Discharge Instructions (Signed)
Discharge instructions:   Call office if you have any of the following:  fever >100.4 F, chills, worsening pain, or any other concerns.   Do not drive while on narcotics.  No intercourse, tampons, douching.  Call your doctor for increased pain or vaginal bleeding, foul smelling vaginal discharge, temperature above 100.4, depression, inability to keep down fluids or uncontrollable nausea, dizziness or vision changes, severe headache, or concerns.  No strenuous activity or heavy lifting until cleared by your doctor.  No intercourse, tampons, douching, or enemas until cleared by your doctor.  No tub baths-showers only.  No driving for 2 weeks or while taking pain medications.  Continue prenatal vitamin and iron.

## 2014-12-11 NOTE — Discharge Summary (Signed)
Gynecology Physician Postoperative Discharge Summary  Patient ID: Monica Mann MRN: [id number] DOB/AGE: 1980-06-04 34 y.o.  Admit Date: 12/08/2014 Discharge Date: 12/11/2014  Diagnoses: Pelvic Inflammatory Disease    CBC Latest Ref Rng 12/08/2014 11/14/2014 12/10/2012  WBC 3.6 - 11.0 K/uL 11.6(H) 19.1(H) 12.0(H)  Hemoglobin 12.0 - 16.0 g/dL 12.4 13.9 13.9  Hematocrit 35.0 - 47.0 % 37.5 41.1 40.9  Platelets 150 - 440 K/uL 226 265 244    Hospital Course:  Monica Mann is a 34 y.o.  Was admitted for IV antibiotics after outpatient treatment was insufficient for her pelvic inflammatory disease.  Please see H&P for these details.  She was given Clindamycin and Gentamicin (PCN allergy) for a 72 hour course and had improved her pain and symptoms drastically. She requested to be discharged home as she was tolerating her pain with PO meds and had remained afebrile.    Discharge Exam: Blood pressure 137/79, pulse 93, temperature 97.5 F (36.4 C), temperature source Oral, resp. rate 20,SpO2 97 %.  General appearance: alert and no distress  Resp: clear to auscultation bilaterally, normal respiratory effort Cardio: regular rate and rhythm  GI: soft, non-tender; bowel sounds normal; no masses, no organomegaly.  Extremities: extremities normal, atraumatic, no cyanosis or edema and Homans sign is negative, no sign of DVT  Discharged Condition: Stable  Disposition: 01-Home or Self Care      Discharge Instructions    Diet - low sodium heart healthy    Complete by:  As directed      Increase activity slowly    Complete by:  As directed             Medication List    TAKE these medications        acetaminophen 325 MG tablet  Commonly known as:  TYLENOL  Take 650 mg by mouth every 6 (six) hours as needed for pain.     azithromycin 250 MG tablet  Commonly known as:  ZITHROMAX  Day 1: 4 tabs once.  Day 2-31, 1 tab po daily     HYDROcodone-acetaminophen 5-325 MG tablet   Commonly known as:  NORCO/VICODIN  Take 1 tablet by mouth every 4 (four) hours as needed for moderate pain.     ibuprofen 800 MG tablet  Commonly known as:  ADVIL,MOTRIN  Take 1 tablet (800 mg total) by mouth every 8 (eight) hours as needed for moderate pain.     meloxicam 15 MG tablet  Commonly known as:  MOBIC  Take 1 tablet (15 mg total) by mouth daily.     metroNIDAZOLE 500 MG tablet  Commonly known as:  FLAGYL  Take 1 tablet (500 mg total) by mouth 2 (two) times daily.     multivitamin tablet  Take 1 tablet by mouth daily.     nitroGLYCERIN 0.4 MG SL tablet  Commonly known as:  NITROSTAT  Place 1 tablet (0.4 mg total) under the tongue every 5 (five) minutes as needed for chest pain.     traMADol 50 MG tablet  Commonly known as:  ULTRAM  Take 1 tablet (50 mg total) by mouth every 6 (six) hours as needed for moderate pain.       Follow-up Information    Follow up with Ward, Honor Loh, MD In 2 weeks.   Specialty:  Obstetrics and Gynecology   Why:  for scheduled follow up   Contact information:   [address] [place] [address] [phone]       Call Ward,  Honor Loh, MD.   Specialty:  Obstetrics and Gynecology   Why:  If symptoms worsen   Contact information:   [address] [place] [address] [phone]       Signed:  Ward, Honor Loh Attending Obstetrician & Gynecologist [facility]

## 2014-12-11 NOTE — Progress Notes (Signed)
Discharge instructions provided.  Pt verbalizes understanding of all instructions and follow-up care.  Prescriptions given.  Pt discharged to home at 1500 on 12/11/14 via wheelchair by volunteer. Reed Breech, RN 12/11/2014 7:46 PM

## 2014-12-13 LAB — CULTURE, BLOOD (ROUTINE X 2)
CULTURE: NO GROWTH
CULTURE: NO GROWTH

## 2015-01-07 ENCOUNTER — Encounter
Admission: RE | Admit: 2015-01-07 | Discharge: 2015-01-07 | Disposition: A | Discharge status: Home / Self Care | Payer: BLUE CROSS/BLUE SHIELD | Source: Ambulatory Visit | Attending: Obstetrics & Gynecology | Admitting: Obstetrics & Gynecology

## 2015-01-07 DIAGNOSIS — Z01812 Encounter for preprocedural laboratory examination: Secondary | ICD-10-CM | POA: Diagnosis not present

## 2015-01-07 HISTORY — DX: Anxiety disorder, unspecified: F41.9

## 2015-01-07 LAB — CBC
HCT: 42.5 % (ref 35.0–47.0)
HEMOGLOBIN: 14.2 g/dL (ref 12.0–16.0)
MCH: 30.8 pg (ref 26.0–34.0)
MCHC: 33.4 g/dL (ref 32.0–36.0)
MCV: 92.3 fL (ref 80.0–100.0)
PLATELETS: 271 10*3/uL (ref 150–440)
RBC: 4.6 MIL/uL (ref 3.80–5.20)
RDW: 12.8 % (ref 11.5–14.5)
WBC: 8.3 10*3/uL (ref 3.6–11.0)

## 2015-01-07 LAB — ABO/RH: ABO/RH(D): O POS

## 2015-01-07 LAB — TYPE AND SCREEN
ABO/RH(D): O POS
Antibody Screen: NEGATIVE

## 2015-01-07 NOTE — Patient Instructions (Signed)
  Your procedure is scheduled on: Thursday Nov. 10, 2016. Report to Same Day Surgery. To find out your arrival time please call [phone] between 1PM - 3PM on Wednesday 9, Nov. 2016.  Remember: Instructions that are not followed completely may result in serious medical risk, up to and including death, or upon the discretion of your surgeon and anesthesiologist your surgery may need to be rescheduled.    _x___ 1. Do not eat food or drink liquids after midnight. No gum chewing or hard candies.     _x___ 2. No Alcohol for 24 hours before or after surgery.   ____ 3. Bring all medications with you on the day of surgery if instructed.    __x__ 4. Notify your doctor if there is any change in your medical condition     (cold, fever, infections).     Do not wear jewelry, make-up, hairpins, clips or nail polish.  Do not wear lotions, powders, or perfumes. You may wear deodorant.  Do not shave 48 hours prior to surgery. Men may shave face and neck.  Do not bring valuables to the hospital.    [facility] is not responsible for any belongings or valuables.               Contacts, dentures or bridgework may not be worn into surgery.  Leave your suitcase in the car. After surgery it may be brought to your room.  For patients admitted to the hospital, discharge time is determined by your treatment team.   Patients discharged the day of surgery will not be allowed to drive home.    Please read over the following fact sheets that you were given:   [facility] Preparing for Surgery  ____ Take these medicines the morning of surgery with A SIP OF WATER: NONE     ____ Fleet Enema (as directed)   __x__ Use CHG Soap as directed  ____ Use inhalers on the day of surgery  ____ Stop metformin 2 days prior to surgery    ____ Take 1/2 of usual insulin dose the night before surgery and none on the morning of surgery.   ____ Stop Coumadin/Plavix/aspirin on does not apply.  _x___ Stop  Anti-inflammatories ibuprofen.  May take Tylenol or Tramadol for pain.   ____ Stop supplements until after surgery.    ____ Bring C-Pap to the hospital.

## 2015-01-14 ENCOUNTER — Ambulatory Visit: Payer: BLUE CROSS/BLUE SHIELD | Admitting: Anesthesiology

## 2015-01-14 ENCOUNTER — Ambulatory Visit
Admission: RE | Admit: 2015-01-14 | Discharge: 2015-01-14 | Disposition: A | Discharge status: Home / Self Care | Payer: BLUE CROSS/BLUE SHIELD | Source: Ambulatory Visit | Attending: Obstetrics & Gynecology | Admitting: Obstetrics & Gynecology

## 2015-01-14 ENCOUNTER — Encounter: Payer: Self-pay | Admitting: Anesthesiology

## 2015-01-14 ENCOUNTER — Encounter
Admission: RE | Disposition: A | Discharge status: Home / Self Care | Payer: Self-pay | Source: Ambulatory Visit | Attending: Obstetrics & Gynecology

## 2015-01-14 DIAGNOSIS — Z803 Family history of malignant neoplasm of breast: Secondary | ICD-10-CM | POA: Insufficient documentation

## 2015-01-14 DIAGNOSIS — Z6836 Body mass index (BMI) 36.0-36.9, adult: Secondary | ICD-10-CM | POA: Insufficient documentation

## 2015-01-14 DIAGNOSIS — N72 Inflammatory disease of cervix uteri: Secondary | ICD-10-CM | POA: Diagnosis not present

## 2015-01-14 DIAGNOSIS — N83202 Unspecified ovarian cyst, left side: Secondary | ICD-10-CM | POA: Insufficient documentation

## 2015-01-14 DIAGNOSIS — G8929 Other chronic pain: Secondary | ICD-10-CM | POA: Insufficient documentation

## 2015-01-14 DIAGNOSIS — Z87891 Personal history of nicotine dependence: Secondary | ICD-10-CM | POA: Insufficient documentation

## 2015-01-14 DIAGNOSIS — F410 Panic disorder [episodic paroxysmal anxiety] without agoraphobia: Secondary | ICD-10-CM | POA: Insufficient documentation

## 2015-01-14 DIAGNOSIS — E669 Obesity, unspecified: Secondary | ICD-10-CM | POA: Insufficient documentation

## 2015-01-14 DIAGNOSIS — N838 Other noninflammatory disorders of ovary, fallopian tube and broad ligament: Secondary | ICD-10-CM | POA: Insufficient documentation

## 2015-01-14 DIAGNOSIS — N888 Other specified noninflammatory disorders of cervix uteri: Secondary | ICD-10-CM | POA: Insufficient documentation

## 2015-01-14 DIAGNOSIS — Z79899 Other long term (current) drug therapy: Secondary | ICD-10-CM | POA: Diagnosis not present

## 2015-01-14 DIAGNOSIS — D271 Benign neoplasm of left ovary: Secondary | ICD-10-CM | POA: Diagnosis not present

## 2015-01-14 DIAGNOSIS — N739 Female pelvic inflammatory disease, unspecified: Secondary | ICD-10-CM | POA: Diagnosis not present

## 2015-01-14 DIAGNOSIS — F419 Anxiety disorder, unspecified: Secondary | ICD-10-CM | POA: Diagnosis not present

## 2015-01-14 DIAGNOSIS — Z9049 Acquired absence of other specified parts of digestive tract: Secondary | ICD-10-CM | POA: Diagnosis not present

## 2015-01-14 DIAGNOSIS — R102 Pelvic and perineal pain: Secondary | ICD-10-CM | POA: Diagnosis present

## 2015-01-14 DIAGNOSIS — Z880 Allergy status to penicillin: Secondary | ICD-10-CM | POA: Insufficient documentation

## 2015-01-14 HISTORY — PX: LAPAROSCOPIC HYSTERECTOMY: SHX1926

## 2015-01-14 SURGERY — HYSTERECTOMY, TOTAL, LAPAROSCOPIC
Anesthesia: General | Laterality: Bilateral | Wound class: Clean Contaminated

## 2015-01-14 MED ORDER — SUCCINYLCHOLINE CHLORIDE 20 MG/ML IJ SOLN
INTRAMUSCULAR | Status: DC | PRN
  Administered 2015-01-14: 140 mg via INTRAVENOUS

## 2015-01-14 MED ORDER — CLINDAMYCIN PHOSPHATE 900 MG/50ML IV SOLN
900.0000 mg | Freq: Once | INTRAVENOUS | Status: AC
  Administered 2015-01-14: 900 mg via INTRAVENOUS

## 2015-01-14 MED ORDER — HYDROMORPHONE HCL 1 MG/ML IJ SOLN
0.2500 mg | INTRAMUSCULAR | Status: DC | PRN
  Administered 2015-01-14 (×3): 0.5 mg via INTRAVENOUS

## 2015-01-14 MED ORDER — FENTANYL CITRATE (PF) 100 MCG/2ML IJ SOLN
25.0000 ug | INTRAMUSCULAR | Status: DC | PRN
  Administered 2015-01-14 (×4): 25 ug via INTRAVENOUS

## 2015-01-14 MED ORDER — HYDROCODONE-ACETAMINOPHEN 5-325 MG PO TABS
ORAL_TABLET | ORAL | Status: AC
  Filled 2015-01-14: qty 1

## 2015-01-14 MED ORDER — HYDROCODONE-ACETAMINOPHEN 5-325 MG PO TABS
1.0000 | ORAL_TABLET | ORAL | Status: DC | PRN
  Administered 2015-01-14: 1 via ORAL

## 2015-01-14 MED ORDER — KETOROLAC TROMETHAMINE 30 MG/ML IJ SOLN
30.0000 mg | Freq: Four times a day (QID) | INTRAMUSCULAR | Status: DC
  Filled 2015-01-14 (×5): qty 1

## 2015-01-14 MED ORDER — TRAMADOL HCL 50 MG PO TABS: 50.0000 mg | ORAL_TABLET | Freq: Four times a day (QID) | ORAL | Status: DC | PRN

## 2015-01-14 MED ORDER — ACETAMINOPHEN 10 MG/ML IV SOLN
INTRAVENOUS | Status: DC | PRN
  Administered 2015-01-14: 1000 mg via INTRAVENOUS

## 2015-01-14 MED ORDER — FENTANYL CITRATE (PF) 100 MCG/2ML IJ SOLN
INTRAMUSCULAR | Status: AC
  Administered 2015-01-14: 25 ug via INTRAVENOUS
  Filled 2015-01-14: qty 2

## 2015-01-14 MED ORDER — IBUPROFEN 800 MG PO TABS: 800.0000 mg | ORAL_TABLET | Freq: Three times a day (TID) | ORAL | Status: DC | PRN

## 2015-01-14 MED ORDER — ACETAMINOPHEN 325 MG PO TABS: 650.0000 mg | ORAL_TABLET | ORAL | Status: DC | PRN

## 2015-01-14 MED ORDER — HEPARIN SODIUM (PORCINE) 5000 UNIT/ML IJ SOLN
INTRAMUSCULAR | Status: AC
  Administered 2015-01-14: 5000 [IU] via SUBCUTANEOUS
  Filled 2015-01-14: qty 1

## 2015-01-14 MED ORDER — ONDANSETRON HCL 4 MG/2ML IJ SOLN
INTRAMUSCULAR | Status: AC
  Filled 2015-01-14: qty 2

## 2015-01-14 MED ORDER — ROCURONIUM BROMIDE 100 MG/10ML IV SOLN
INTRAVENOUS | Status: DC | PRN
  Administered 2015-01-14: 10 mg via INTRAVENOUS
  Administered 2015-01-14: 20 mg via INTRAVENOUS
  Administered 2015-01-14: 5 mg via INTRAVENOUS
  Administered 2015-01-14 (×2): 10 mg via INTRAVENOUS
  Administered 2015-01-14: 35 mg via INTRAVENOUS

## 2015-01-14 MED ORDER — BUPIVACAINE HCL 0.5 % IJ SOLN
INTRAMUSCULAR | Status: DC | PRN
Start: 2015-01-14 — End: 2015-01-14
  Administered 2015-01-14: 10 mL

## 2015-01-14 MED ORDER — GENTAMICIN SULFATE 40 MG/ML IJ SOLN
1.5000 mg/kg | Freq: Once | INTRAVENOUS | Status: AC
  Administered 2015-01-14: 180 mg via INTRAVENOUS
  Filled 2015-01-14: qty 4.5

## 2015-01-14 MED ORDER — ONDANSETRON HCL 4 MG/2ML IJ SOLN
4.0000 mg | Freq: Once | INTRAMUSCULAR | Status: AC | PRN
  Administered 2015-01-14: 4 mg via INTRAVENOUS

## 2015-01-14 MED ORDER — ONDANSETRON HCL 4 MG/2ML IJ SOLN
INTRAMUSCULAR | Status: DC | PRN
  Administered 2015-01-14: 4 mg via INTRAVENOUS

## 2015-01-14 MED ORDER — MIDAZOLAM HCL 2 MG/2ML IJ SOLN
INTRAMUSCULAR | Status: DC | PRN
  Administered 2015-01-14: 2 mg via INTRAVENOUS

## 2015-01-14 MED ORDER — FENTANYL CITRATE (PF) 100 MCG/2ML IJ SOLN
INTRAMUSCULAR | Status: DC | PRN
  Administered 2015-01-14: 50 ug via INTRAVENOUS
  Administered 2015-01-14 (×2): 100 ug via INTRAVENOUS
  Administered 2015-01-14: 50 ug via INTRAVENOUS

## 2015-01-14 MED ORDER — FAMOTIDINE 20 MG PO TABS
ORAL_TABLET | ORAL | Status: AC
  Administered 2015-01-14: 20 mg via ORAL
  Filled 2015-01-14: qty 1

## 2015-01-14 MED ORDER — CLINDAMYCIN PHOSPHATE 900 MG/50ML IV SOLN
INTRAVENOUS | Status: AC
  Administered 2015-01-14: 900 mg via INTRAVENOUS
  Filled 2015-01-14: qty 50

## 2015-01-14 MED ORDER — KETOROLAC TROMETHAMINE 60 MG/2ML IM SOLN
INTRAMUSCULAR | Status: AC
  Filled 2015-01-14: qty 2

## 2015-01-14 MED ORDER — HEPARIN SODIUM (PORCINE) 5000 UNIT/ML IJ SOLN
5000.0000 [IU] | Freq: Once | INTRAMUSCULAR | Status: AC
  Administered 2015-01-14: 5000 [IU] via SUBCUTANEOUS

## 2015-01-14 MED ORDER — SUGAMMADEX SODIUM 500 MG/5ML IV SOLN
INTRAVENOUS | Status: DC | PRN
  Administered 2015-01-14: 235.8 mg via INTRAVENOUS

## 2015-01-14 MED ORDER — PHENYLEPHRINE HCL 10 MG/ML IJ SOLN
INTRAMUSCULAR | Status: DC | PRN
  Administered 2015-01-14 (×2): 100 ug via INTRAVENOUS

## 2015-01-14 MED ORDER — LIDOCAINE HCL (CARDIAC) 20 MG/ML IV SOLN
INTRAVENOUS | Status: DC | PRN
Start: 2015-01-14 — End: 2015-01-14
  Administered 2015-01-14: 100 mg via INTRAVENOUS

## 2015-01-14 MED ORDER — ACETAMINOPHEN 10 MG/ML IV SOLN
INTRAVENOUS | Status: AC
  Filled 2015-01-14: qty 100

## 2015-01-14 MED ORDER — PROPOFOL 10 MG/ML IV BOLUS
INTRAVENOUS | Status: DC | PRN
  Administered 2015-01-14: 200 mg via INTRAVENOUS

## 2015-01-14 MED ORDER — DEXAMETHASONE SODIUM PHOSPHATE 4 MG/ML IJ SOLN
INTRAMUSCULAR | Status: DC | PRN
  Administered 2015-01-14: 8 mg via INTRAVENOUS

## 2015-01-14 MED ORDER — FAMOTIDINE 20 MG PO TABS
20.0000 mg | ORAL_TABLET | Freq: Once | ORAL | Status: AC
Start: 2015-01-14 — End: 2015-01-14
  Administered 2015-01-14: 20 mg via ORAL

## 2015-01-14 MED ORDER — HYDROCODONE-ACETAMINOPHEN 5-325 MG PO TABS: 1.0000 | ORAL_TABLET | ORAL | Status: DC | PRN

## 2015-01-14 MED ORDER — BUPIVACAINE HCL (PF) 0.5 % IJ SOLN
INTRAMUSCULAR | Status: AC
  Filled 2015-01-14: qty 30

## 2015-01-14 MED ORDER — LACTATED RINGERS IV SOLN
INTRAVENOUS | Status: DC
  Administered 2015-01-14: 13:00:00 via INTRAVENOUS

## 2015-01-14 MED ORDER — HYDROMORPHONE HCL 1 MG/ML IJ SOLN
INTRAMUSCULAR | Status: AC
  Administered 2015-01-14: 0.5 mg via INTRAVENOUS
  Filled 2015-01-14: qty 1

## 2015-01-14 MED ORDER — KETOROLAC TROMETHAMINE 30 MG/ML IJ SOLN
INTRAMUSCULAR | Status: AC
  Administered 2015-01-14: 30 mg
  Filled 2015-01-14: qty 1

## 2015-01-14 MED ORDER — HYDROMORPHONE HCL 1 MG/ML IJ SOLN
INTRAMUSCULAR | Status: AC
  Filled 2015-01-14: qty 1

## 2015-01-14 SURGICAL SUPPLY — 50 items
BAG URO DRAIN 2000ML W/SPOUT (MISCELLANEOUS) ×3 IMPLANT
BLADE SURG SZ11 CARB STEEL (BLADE) ×3 IMPLANT
CANISTER SUCT 1200ML W/VALVE (MISCELLANEOUS) ×3 IMPLANT
CATH FOLEY 2WAY  5CC 16FR (CATHETERS) ×2
CATH URTH 16FR FL 2W BLN LF (CATHETERS) ×1 IMPLANT
CHLORAPREP W/TINT 26ML (MISCELLANEOUS) ×3 IMPLANT
DEFOGGER SCOPE WARMER CLEARIFY (MISCELLANEOUS) ×3 IMPLANT
DEVICE SUTURE ENDOST 10MM (ENDOMECHANICALS) ×3 IMPLANT
DRAPE LEGGINS SURG 28X43 STRL (DRAPES) ×3 IMPLANT
DRAPE UNDER BUTTOCK W/FLU (DRAPES) ×3 IMPLANT
DRSG TEGADERM 2-3/8X2-3/4 SM (GAUZE/BANDAGES/DRESSINGS) ×9 IMPLANT
ENDOSTITCH 0 SINGLE 48 (SUTURE) ×6 IMPLANT
GAUZE SPONGE NON-WVN 2X2 STRL (MISCELLANEOUS) ×1 IMPLANT
GLOVE SURG SYN 6.5 ES PF (GLOVE) ×15 IMPLANT
GOWN STRL REUS W/ TWL LRG LVL3 (GOWN DISPOSABLE) ×3 IMPLANT
GOWN STRL REUS W/ TWL XL LVL3 (GOWN DISPOSABLE) ×1 IMPLANT
GOWN STRL REUS W/TWL LRG LVL3 (GOWN DISPOSABLE) ×6
GOWN STRL REUS W/TWL XL LVL3 (GOWN DISPOSABLE) ×2
HIBICLENS CHG 4% 32OZ (MISCELLANEOUS) IMPLANT
IRRIGATION STRYKERFLOW (MISCELLANEOUS) ×1 IMPLANT
IRRIGATOR STRYKERFLOW (MISCELLANEOUS) ×3
IV LACTATED RINGERS 1000ML (IV SOLUTION) ×3 IMPLANT
KIT PINK PAD W/HEAD ARE REST (MISCELLANEOUS) ×3
KIT PINK PAD W/HEAD ARM REST (MISCELLANEOUS) ×1 IMPLANT
KIT RM TURNOVER CYSTO AR (KITS) IMPLANT
LABEL OR SOLS (LABEL) ×3 IMPLANT
LIGASURE BLUNT 5MM 37CM (INSTRUMENTS) ×3 IMPLANT
LIQUID BAND (GAUZE/BANDAGES/DRESSINGS) ×3 IMPLANT
MANIPULATOR VCARE LG CRV RETR (MISCELLANEOUS) IMPLANT
MANIPULATOR VCARE STD CRV RETR (MISCELLANEOUS) IMPLANT
NDL SAFETY 22GX1.5 (NEEDLE) ×3 IMPLANT
NEEDLE VERESS 14GA 120MM (NEEDLE) ×3 IMPLANT
NS IRRIG 500ML POUR BTL (IV SOLUTION) ×3 IMPLANT
PACK LAP CHOLECYSTECTOMY (MISCELLANEOUS) ×3 IMPLANT
PAD OB MATERNITY 4.3X12.25 (PERSONAL CARE ITEMS) ×3 IMPLANT
PAD PREP 24X41 OB/GYN DISP (PERSONAL CARE ITEMS) ×3 IMPLANT
PENCIL ELECTRO HAND CTR (MISCELLANEOUS) ×3 IMPLANT
SCISSORS METZENBAUM CVD 33 (INSTRUMENTS) ×3 IMPLANT
SET CYSTO W/LG BORE CLAMP LF (SET/KITS/TRAYS/PACK) IMPLANT
SLEEVE ENDOPATH XCEL 5M (ENDOMECHANICALS) ×6 IMPLANT
SPONGE LAP 18X18 5 PK (GAUZE/BANDAGES/DRESSINGS) IMPLANT
SPONGE VERSALON 2X2 STRL (MISCELLANEOUS) ×2
SURGILUBE 2OZ TUBE FLIPTOP (MISCELLANEOUS) ×3 IMPLANT
SUT ENDO VLOC 180-0-8IN (SUTURE) ×6 IMPLANT
SUT VIC AB 0 CT1 36 (SUTURE) ×3 IMPLANT
SUT VIC AB 2-0 UR6 27 (SUTURE) IMPLANT
SYR 50ML LL SCALE MARK (SYRINGE) ×3 IMPLANT
SYRINGE 10CC LL (SYRINGE) ×6 IMPLANT
TROCAR XCEL NON-BLD 5MMX100MML (ENDOMECHANICALS) ×3 IMPLANT
TUBING INSUFFLATOR HEATED (MISCELLANEOUS) ×3 IMPLANT

## 2015-01-14 NOTE — H&P (Signed)
H&P Update  PLEASE SEE PAPER H&P  Pt was last seen in my office, and complete history and physical performed.  The surgical history has been reviewed and remains accurate without interval change. The patient was re-examined and patient's physiologic condition has not changed significantly in the last 30 days.  No new pharmacological allergies or types of therapy has been initiated. She completed her antibiotics 6 days ago.  Allergies  Allergen Reactions  . Bee Venom Anaphylaxis  . Penicillins Hives  . Adhesive [Tape] Other (See Comments)    Blisters if left on a prolong period of time. Paper tape is ok    Past Medical History  Diagnosis Date  . Anxiety     hx of panic attacks   Past Surgical History  Procedure Laterality Date  . Laparoscopic appendectomy    . Cholecystectomy  2014  . Knee surgery      left knee  . Elbow surgery      right  . Appendectomy    . Fracture surgery Right 2008    forearm and elbow  . Dilation and curettage of uterus  01/13/2011  . Myringotomy with tube placement  1988  . Knee arthroscopy Left   . Tonsillectomy      BP 145/89 mmHg  Pulse 106  Temp(Src) 98.4 F (36.9 C) (Oral)  Resp 16  SpO2 98%  NAD RRR no murmurs CTAB, no wheezing, resps unlabored +BS, soft, NTTP No c/c/e Pelvic exam deferred  The above history was confirmed with the patient. The condition still exists that makes this procedure necessary. Surgical plan includes total laparoscopic hysterectomy, bilateral salpingectomy, as confirmed on the consent. The treatment plan remains the same, without new options for care.  The patient understands the potential benefits and risks and the consents have been signed and placed on the chart.     Larey Days, MD Attending Obstetrician Gynecologist [facility]

## 2015-01-14 NOTE — Anesthesia Procedure Notes (Signed)
Procedure Name: Intubation Date/Time: 01/14/2015 2:49 PM Performed by: Johnna Acosta Pre-anesthesia Checklist: Patient identified, Emergency Drugs available, Suction available and Patient being monitored Patient Re-evaluated:Patient Re-evaluated prior to inductionOxygen Delivery Method: Circle system utilized Preoxygenation: Pre-oxygenation with 100% oxygen Intubation Type: IV induction Ventilation: Two handed mask ventilation required and Oral airway inserted - appropriate to patient size Laryngoscope Size: Sabra Heck and 2 Grade View: Grade II Tube type: Oral Tube size: 7.0 mm Number of attempts: 1 Placement Confirmation: ETT inserted through vocal cords under direct vision and positive ETCO2 Secured at: 21 cm Tube secured with: Tape Dental Injury: Teeth and Oropharynx as per pre-operative assessment

## 2015-01-14 NOTE — Op Note (Signed)
Total Laparoscopic Hysterectomy Operative Note Procedure Date: 01/14/2015  Patient:  Monica Mann  34 y.o. female  PRE-OPERATIVE DIAGNOSIS:  ACUTE ABDOMINAL PAIN, pelvic inflammatory disease  POST-OPERATIVE DIAGNOSIS:  ACUTE ABDOMINAL PAIN,ADNEXALMASS  PROCEDURE:  Procedure(s): HYSTERECTOMY TOTAL LAPAROSCOPIC/BILATERAL SALPINGECTOMY/left ovarian cystectomy (Bilateral)  SURGEON:  Surgeon(s) and Role:    * Chelsea C Ward, MD - Primary    * Malachy Mood, MD - Assisting  ANESTHESIA:  General via ET  I/O  Total I/O In: 800 [I.V.:800] Out: 800 [Urine:500; Blood:300]  FINDINGS:  Small uterus, normal right ovary and tube, normal left tube and 4cm left ovarian cyst.  Congested and dilated vessels on left pelvis in broad ligament. Hemosiderin deposits all <1cm sporadically along bladder peritoneum.  SPECIMEN: Uterus, Cervix, and bilateral fallopian tubes, left ovarian cyst wall.  COMPLICATIONS: none apparent  DISPOSITION: vital signs stable to PACU  Indication for Surgery: 34 y.o. who was treated for PID with 6 weeks of antibiotics and chronic pelvic pain.  Her pain is primarily with her periods, and debilitating during daily life.  She presented to the ED with acute worsening of this pain and was eventually treated with both inpatient and outpaitent antibiotics.  In spite of this her pain persisted and she requested definitive treatment.  Risks of surgery were discussed with the patient including but not limited to: bleeding which may require transfusion or reoperation; infection which may require antibiotics; injury to bowel, bladder, ureters or other surrounding organs; need for additional procedures including laparotomy, blood clot, incisional problems and other postoperative/anesthesia complications. Written informed consent was obtained.      PROCEDURE IN DETAIL:  The patient had 5000u Heparin Sub-q and sequential compression devices applied to her lower extremities while in  the preoperative area.  She was then taken to the operating room where general anesthesia was administered and was found to be adequate.  She was placed in the dorsal lithotomy position, and was prepped and draped in a sterile manner. A surgical time-out was performed.  A Foley catheter was inserted into her bladder and attached to constant drainage and a V-Care uterine manipulator was then advanced into the uterus and a good fit around the cervix was noted. The gloves were changed, and attention was turned to the abdomen where an umbilical incision was made with the scalpel. A Veress needle was inserted and a drop test confirmed appropriate position.  Opening pressure was 30mmHg, and the abdomen was insufflated to 15mg Hg carbon dioxide gas and adequate pneumoperitoneum was obtained. A 3mm trochar was inserted in the umbilical incision using a visiport method. A survey of the patient's pelvis and abdomen revealed the findings as mentioned above. Two 34mm ports were inserted in the lower left and right quadrants under visualization.    The bilateral fallopian tubes were separated from the mesosalpinx using the Ligasure. The bilateral round ligaments were transected and anterior broad ligament divided and brought across the uterus to separate the vesicouterine peritoneum and create a bladder flap. The bladder was pushed away from the uterus. The bilateral uterine arteries were ligated and transected. The bilateral uterosacral and cardinal ligaments were ligated and transected. A colpotomy was made around the V-Care cervical cup and the uterus, cervix, and bilateral tubes were removed through the vagina. The 70mm umbilical port was exchanged for a 54mm balloon port, and the vaginal cuff was closed using the Endostitch device and V-Lock sutures from each apex. This was tested for integrity using the surgeon's finger. After a change of gloves, the  pneumoperitoneum was recreated and surgical site inspected, and  found to be hemostatic. Bilateral ureters were visualized vermiuclating. The ovarian serosa was divided along the cyst and inadvertently the cyst was ruptured for clear straw coloured fluid. The capsule was secured and the cyst wall was grasped and the cyst wall removed.  The ovarian bed was hemostatic.  The pneumoperitoneum was deflated and recreated to test for hemostasis off pressure.  This was confirmed.  No intraoperative injury to surrounding organs was noted. The abdomen was desufflated and all instruments were then removed.   The fascia of the umbilical incision was closed with vicryl. All skin incisions were closed with 4-0 monocryl and covered with surgical glue. The patient tolerated the procedures well.  All instruments, needles, and sponge counts were correct x 2. The patient was taken to the recovery room in stable condition.   ---- Larey Days, MD Attending Obstetrician and Gynecologist [facility]

## 2015-01-14 NOTE — Discharge Instructions (Signed)
Discharge instructions:   Call office if you have any of the following: headache, visual changes, fever >100 F, chills, breast concerns, excessive vaginal bleeding, incision drainage or problems, leg pain or redness, or any other concerns.   Activity: Do not lift > 10 lbs for 6 weeks.  No intercourse or tampons for 6 weeks.  No driving for 1-2 weeks.  No tub baths-showers only.  You may experience some acute pain in your upper right abdomen, under your rib.  This is normal and due to the collection of gas during the surgery.  It will resolve over time.  Be patient!  This may also irritate your right shoulder.  Don't be surprised.     AMBULATORY SURGERY  DISCHARGE INSTRUCTIONS   1) The drugs that you were given will stay in your system until tomorrow so for the next 24 hours you should not:  A) Drive an automobile B) Make any legal decisions C) Drink any alcoholic beverage   2) You may resume regular meals tomorrow.  Today it is better to start with liquids and gradually work up to solid foods.  You may eat anything you prefer, but it is better to start with liquids, then soup and crackers, and gradually work up to solid foods.   3) Please notify your doctor immediately if you have any unusual bleeding, trouble breathing, redness and pain at the surgery site, drainage, fever, or pain not relieved by medication. 4)   5) Your post-operative visit with Dr.                                     is: Date:                        Time:    Please call to schedule your post-operative visit.  6) Additional Instructions: 7)

## 2015-01-14 NOTE — Transfer of Care (Signed)
Immediate Anesthesia Transfer of Care Note  Patient: Monica Mann  Procedure(s) Performed: Procedure(s): HYSTERECTOMY TOTAL LAPAROSCOPIC/BILATERAL SALPINGECTOMY/left ovarian cystectomy (Bilateral)  Patient Location: PACU  Anesthesia Type:General  Level of Consciousness: sedated and responds to stimulation  Airway & Oxygen Therapy: Patient Spontanous Breathing and Patient connected to face mask oxygen  Post-op Assessment: Report given to RN and Post -op Vital signs reviewed and stable  Post vital signs: Reviewed and stable  Last Vitals:  Filed Vitals:   01/14/15 1738  BP: 138/79  Pulse: 93  Temp: 37.3 C  Resp: 27    Complications: No apparent anesthesia complications

## 2015-01-14 NOTE — Anesthesia Preprocedure Evaluation (Signed)
Anesthesia Evaluation  Patient identified by MRN, date of birth, ID band Patient awake    Reviewed: Allergy & Precautions, H&P , NPO status , Patient's Chart, lab work & pertinent test results  History of Anesthesia Complications Negative for: history of anesthetic complications  Airway Mallampati: III  TM Distance: >3 FB Neck ROM: full    Dental  (+) Poor Dentition, Chipped   Pulmonary neg shortness of breath, former smoker,    Pulmonary exam normal breath sounds clear to auscultation       Cardiovascular Exercise Tolerance: Good (-) angina(-) Past MI and (-) DOE negative cardio ROS Normal cardiovascular exam Rhythm:regular Rate:Normal     Neuro/Psych PSYCHIATRIC DISORDERS Anxiety negative neurological ROS     GI/Hepatic negative GI ROS, Neg liver ROS, neg GERD  ,  Endo/Other  negative endocrine ROS  Renal/GU negative Renal ROS  negative genitourinary   Musculoskeletal   Abdominal   Peds  Hematology negative hematology ROS (+)   Anesthesia Other Findings Past Medical History:   Anxiety                                                        Comment:hx of panic attacks  Past Surgical History:   LAPAROSCOPIC APPENDECTOMY                                     CHOLECYSTECTOMY                                  2014         KNEE SURGERY                                                    Comment:left knee   ELBOW SURGERY                                                   Comment:right   APPENDECTOMY                                                  FRACTURE SURGERY                                Right 2008           Comment:forearm and elbow   DILATION AND CURETTAGE OF UTERUS                 01/13/2011    MYRINGOTOMY WITH TUBE PLACEMENT                  1988         KNEE ARTHROSCOPY  Left              TONSILLECTOMY                                                   Reproductive/Obstetrics negative OB ROS                             Anesthesia Physical Anesthesia Plan  ASA: III  Anesthesia Plan: General ETT   Post-op Pain Management:    Induction:   Airway Management Planned:   Additional Equipment:   Intra-op Plan:   Post-operative Plan:   Informed Consent: I have reviewed the patients History and Physical, chart, labs and discussed the procedure including the risks, benefits and alternatives for the proposed anesthesia with the patient or authorized representative who has indicated his/her understanding and acceptance.   Dental Advisory Given  Plan Discussed with: Anesthesiologist, CRNA and Surgeon  Anesthesia Plan Comments:         Anesthesia Quick Evaluation

## 2015-01-15 ENCOUNTER — Encounter: Payer: Self-pay | Admitting: Obstetrics & Gynecology

## 2015-01-15 LAB — POCT PREGNANCY, URINE: Preg Test, Ur: NEGATIVE

## 2015-01-15 NOTE — Anesthesia Postprocedure Evaluation (Signed)
  Anesthesia Post-op Note  Patient: Monica Mann  Procedure(s) Performed: Procedure(s): HYSTERECTOMY TOTAL LAPAROSCOPIC/BILATERAL SALPINGECTOMY/left ovarian cystectomy (Bilateral)  Anesthesia type:General ETT  Patient location: PACU  Post pain: Pain level controlled  Post assessment: Post-op Vital signs reviewed, Patient's Cardiovascular Status Stable, Respiratory Function Stable, Patent Airway and No signs of Nausea or vomiting  Post vital signs: Reviewed and stable  Last Vitals:  Filed Vitals:   01/14/15 1951  BP: 147/96  Pulse: 79  Temp:   Resp: 20    Level of consciousness: awake, alert  and patient cooperative  Complications: No apparent anesthesia complications

## 2015-01-19 LAB — SURGICAL PATHOLOGY

## 2016-04-12 IMAGING — CT CT ABD-PELV W/ CM
2 of 5 series · 16 of 46 positions shown, 18 images · IV contrast (APPLIED)
Comparison: None.

CLINICAL DATA: 33-year-old female with mid abdominal and pelvic
pain.

EXAM:
CT ABDOMEN AND PELVIS WITH CONTRAST
TECHNIQUE: Multidetector CT imaging of the abdomen and pelvis was performed
using the standard protocol following bolus administration of
intravenous contrast.
CONTRAST:  100mL OMNIPAQUE IOHEXOL 300 MG/ML SOLN, 25mL OMNIPAQUE
IOHEXOL 300 MG/ML SOLN

[Series 2: abd/pelvis 5.0 b31f · axial · 0.93mm/px · z∈[-490,-45]mm · 13 of 101 slices shown, 15 images]
[im 6/101  soft-tissue]
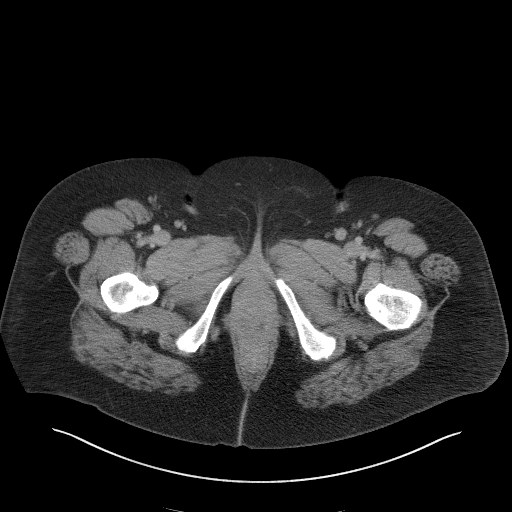
[im 6/101  bone]
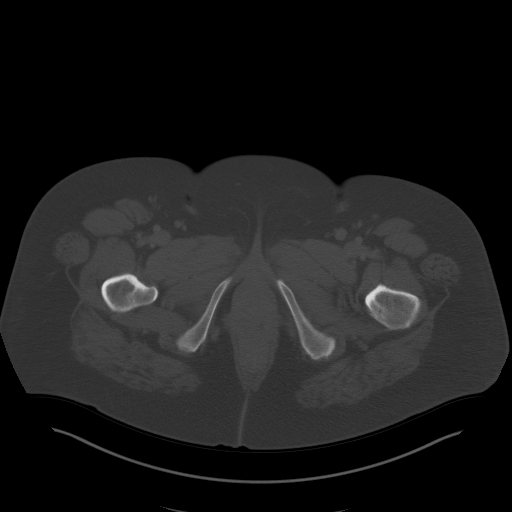
[im 16/101  soft-tissue]
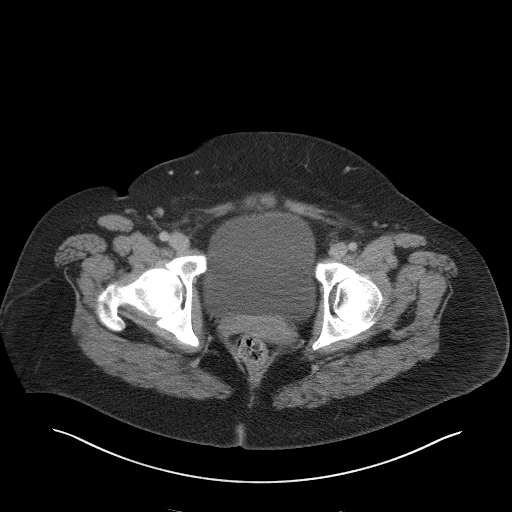
[im 22/101  soft-tissue]
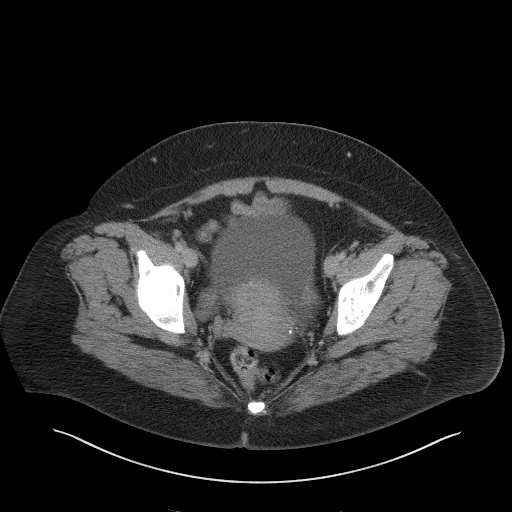
[im 27/101  soft-tissue]
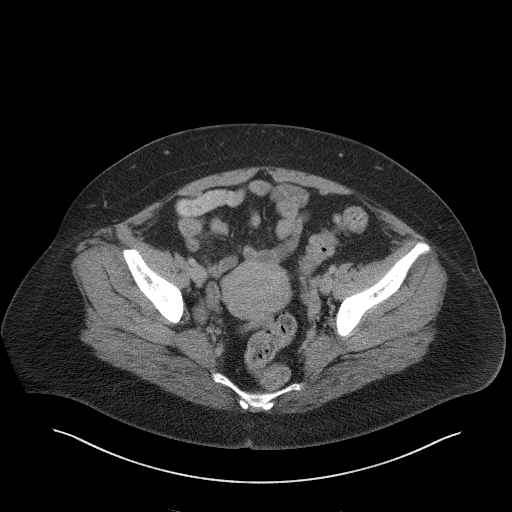
[im 37/101  soft-tissue]
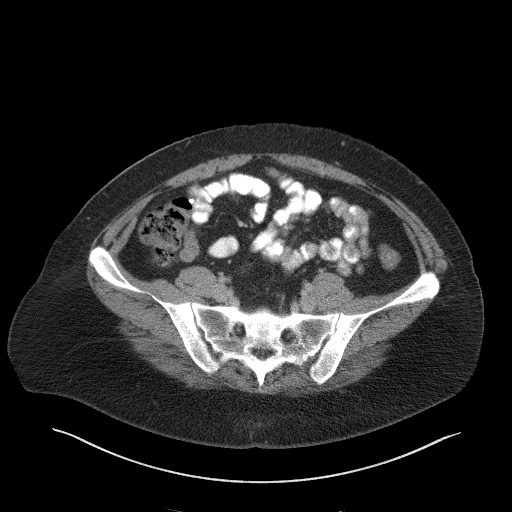
[im 43/101  soft-tissue]
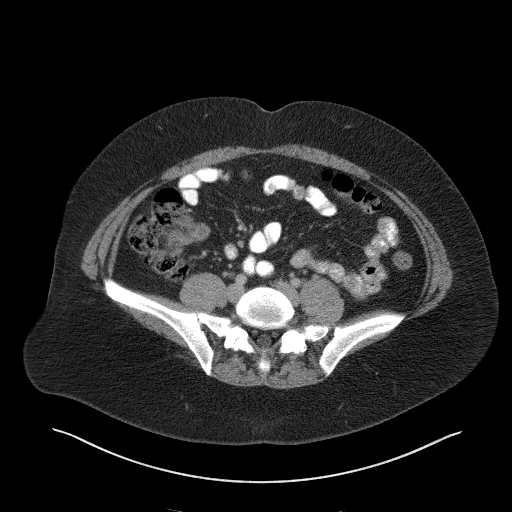
[im 53/101  soft-tissue]
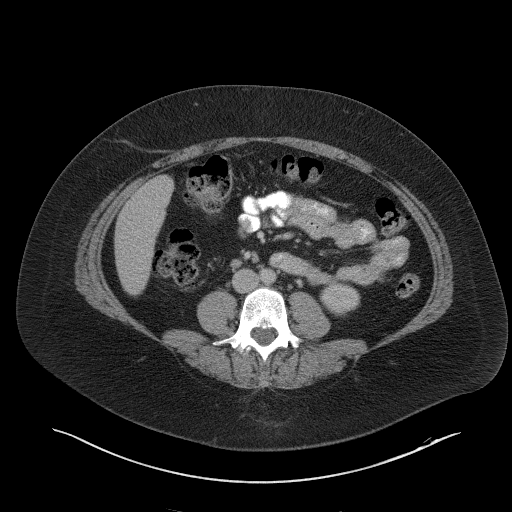
[im 58/101  soft-tissue]
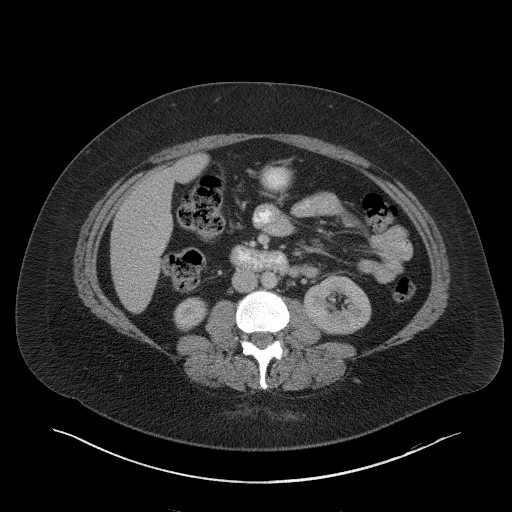
[im 64/101  soft-tissue]
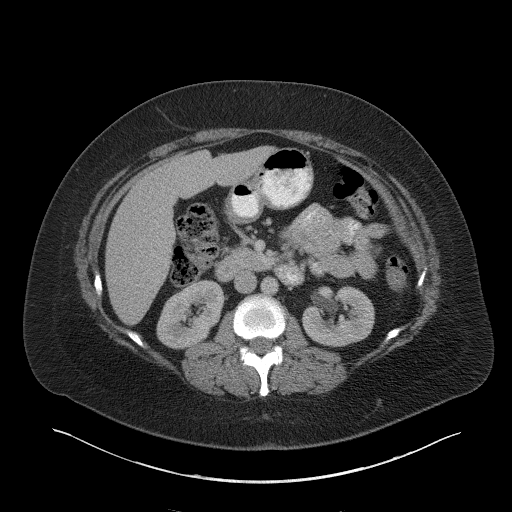
[im 64/101  bone]
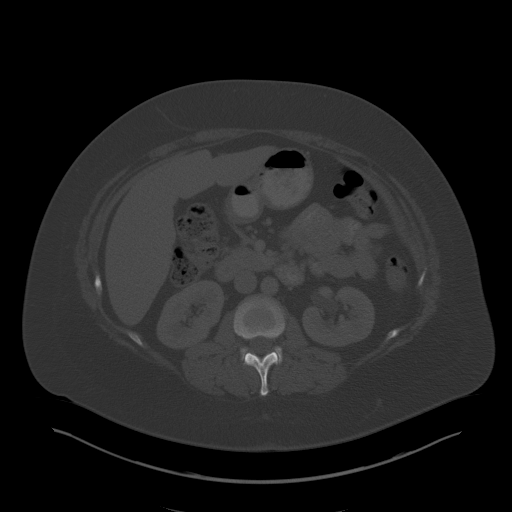
[im 74/101  soft-tissue]
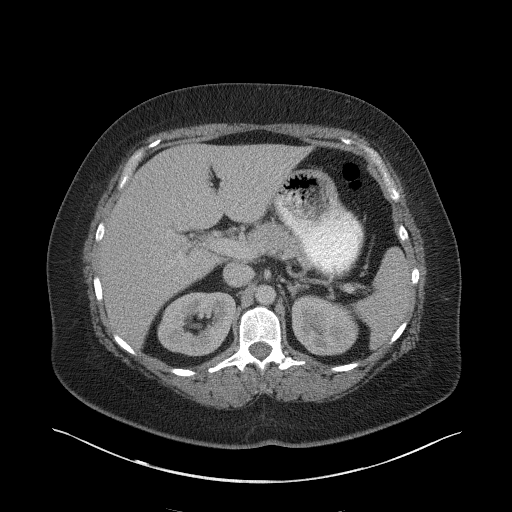
[im 79/101  soft-tissue]
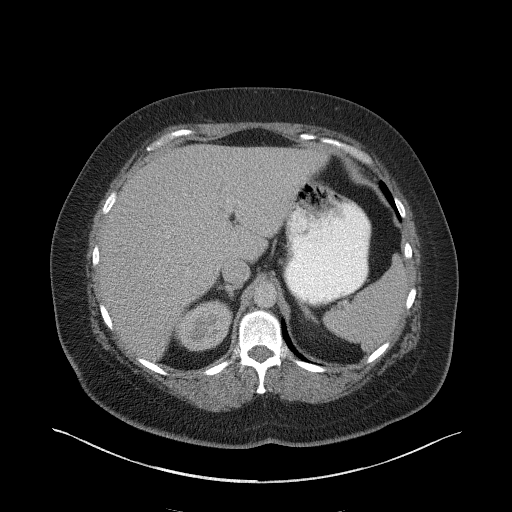
[im 85/101  soft-tissue]
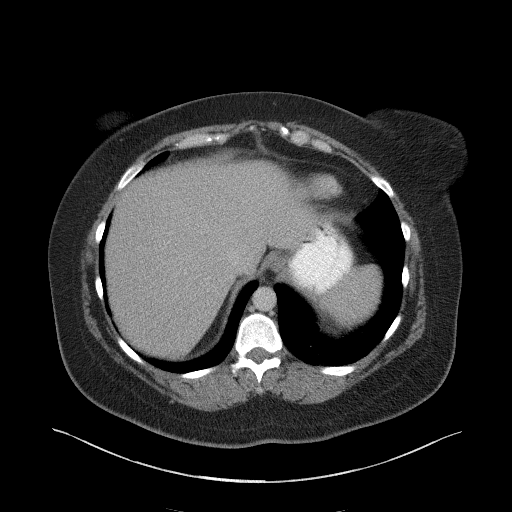
[im 95/101  soft-tissue]
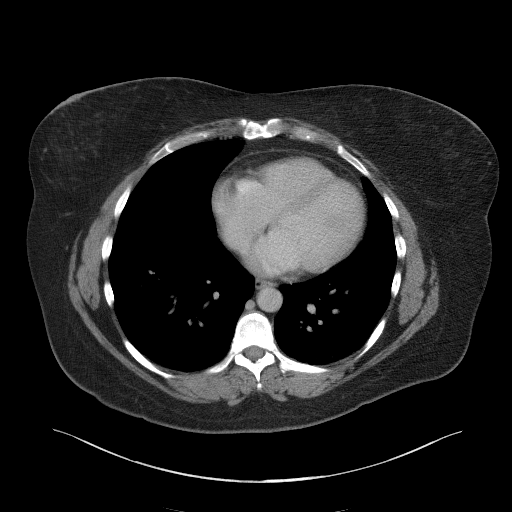

[Series 5: abd/pelvis 3.0 coronal · coronal · 0.81mm/px · 3 of 86 slices shown]
[im 29/86  soft-tissue]
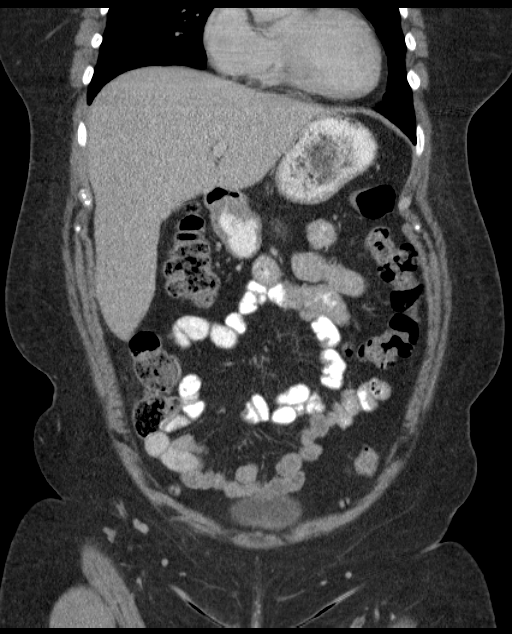
[im 38/86  soft-tissue]
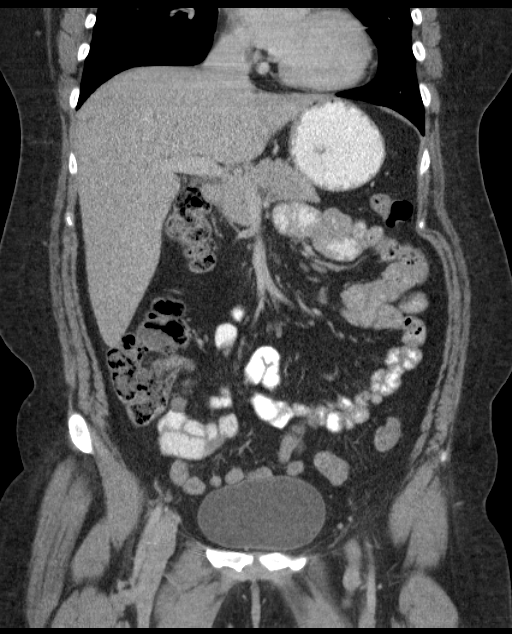
[im 48/86  soft-tissue]
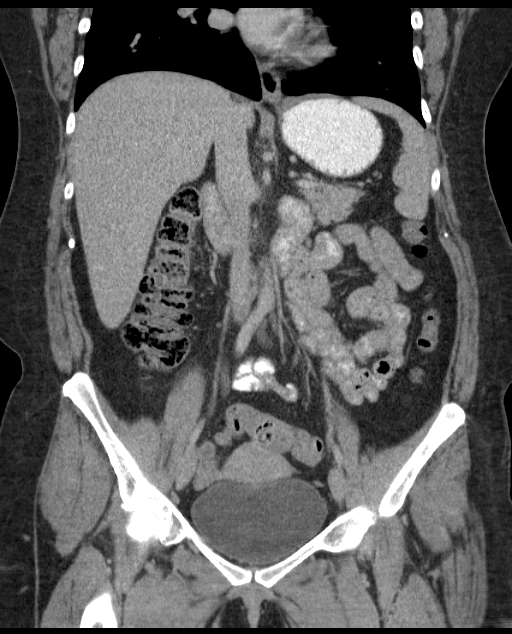

[16 of 46 positions shown; findings below may reference images not displayed]

FINDINGS: Minimal bibasilar dependent atelectatic changes. The visualized lung
bases are clear. No intra-abdominal free air or free fluid.

Cholecystectomy. The liver, pancreas, spleen, adrenal glands,
kidneys, visualized ureters, and urinary bladder appear
unremarkable. The uterus is anteverted and appears unremarkable.
There is a 3 cm dominant left ovarian follicle/cyst. Ultrasound may
provide better evaluation of the pelvic structures.

Moderate stool throughout the colon no evidence of bowel obstruction
or inflammation. Appendectomy.

The abdominal aorta and IVC appear unremarkable. No portal venous
gas identified. The visualized osseous structures are unremarkable.
No acute fracture.
IMPRESSION: A 3 cm dominant left ovarian follicle/ cyst. No other acute
intra-abdominal or pelvic pathology identified.

## 2016-08-08 ENCOUNTER — Encounter: Payer: Self-pay | Admitting: Neurology

## 2016-08-08 ENCOUNTER — Telehealth: Payer: Self-pay | Admitting: Neurology

## 2016-08-08 ENCOUNTER — Encounter (INDEPENDENT_AMBULATORY_CARE_PROVIDER_SITE_OTHER): Payer: Self-pay

## 2016-08-08 ENCOUNTER — Ambulatory Visit (INDEPENDENT_AMBULATORY_CARE_PROVIDER_SITE_OTHER): Payer: BLUE CROSS/BLUE SHIELD | Admitting: Neurology

## 2016-08-08 VITALS — Ht 71.0 in | Wt 258.0 lb

## 2016-08-08 DIAGNOSIS — R519 Headache, unspecified: Secondary | ICD-10-CM

## 2016-08-08 DIAGNOSIS — E669 Obesity, unspecified: Secondary | ICD-10-CM

## 2016-08-08 DIAGNOSIS — R51 Headache: Secondary | ICD-10-CM | POA: Diagnosis not present

## 2016-08-08 DIAGNOSIS — R351 Nocturia: Secondary | ICD-10-CM | POA: Diagnosis not present

## 2016-08-08 DIAGNOSIS — R296 Repeated falls: Secondary | ICD-10-CM | POA: Diagnosis not present

## 2016-08-08 DIAGNOSIS — R2689 Other abnormalities of gait and mobility: Secondary | ICD-10-CM

## 2016-08-08 DIAGNOSIS — R42 Dizziness and giddiness: Secondary | ICD-10-CM

## 2016-08-08 DIAGNOSIS — R55 Syncope and collapse: Secondary | ICD-10-CM | POA: Diagnosis not present

## 2016-08-08 NOTE — Telephone Encounter (Signed)
Can we just do MRIs first then? Can I cancel the MRAs?

## 2016-08-08 NOTE — Progress Notes (Signed)
Subjective:    Patient ID: Monica Mann is a 36 y.o. female.  HPI     Star Age, MD, PhD [facility] [address] [place], [place] [address]  Dear Dr. Joneen Caraway,   I saw your patient, Monica Mann, upon your kind request in my neurologic clinic today for initial consultation of her dizziness. Patient is unaccompanied today. As you know, [name] is a 36 year old right-handed woman with an underlying medical history of obesity, hyperlipidemia, anxiety, allergies, prediabetes, vitamin D deficiency, smoking, and recurrent headaches, who reports difficulty with her balance and recurrent falls for the past nearly a year. She was hospitalized at [facility] for this and severe headache. She was noted to have some left-sided weakness as I understand. She had extensive workup from what she tells me. However, hospital records are not available for my review today. She reports that sometimes she collapses and falls and has no awareness of this. She feels like she blacks out. She had a C doppler test at [facility] on 06/13/16 which showed: less than 39% b/l ICA stenosis.  She also had an EEG recently and I reviewed the test results. She had an EEG on 06/29/2016 which showed: This awake and drowsy EEG was normal. There was no epileptiform discharges, lateralizing activity nor seizures seen.  I reviewed your office note from 07/12/2016, which you kindly included. She fell yesterday at a store. She has not had any head CT or brain MRI. She has fallen numerous times. She had a head CT without contrast on 09/10/2015 which showed: No evidence of acute intracranial abnormality. Cerebellar tonsils appear low-lying, cannot exclude Chiari I malformation. Nonspecific symmetric mild thickening of the posterior nasopharyngeal soft tissues, suggesting lymphoid hypertrophy. She had a brain MRI with and without contrast on 09/11/2015 which showed cerebral volume  is normal. No restricted diffusion to suggest acute infarction. No midline shift, mass effect, evidence of mass lesion, ventriculomegaly, extra-axial collection or acute intracranial hemorrhage. Cervicomedullary junction and pituitary are within normal limits. Major intracranial vascular flow voids appear normal. There is tortuosity of the distal cervical right ICA just below the skull base. Pearline Cables and white matter signal is within normal limits for age throughout the brain. One or 2 small foci of nonspecific white matter FLAIR hyperintensity in the right frontal lobe. No chronic cerebral blood products. No cortical and severe malacia. No abnormal enhancement identified. No dural thickening. Visible internal auditory structures. Normal. Mild left mastoid fluid. Negative nasopharynx. She had a repeat head CT without contrast on 07/14/2016 which I reviewed: Chronic appearing changes in the ethmoid sinuses bilaterally. No acute intracranial abnormality. Of note, she was seen by Dr. Mee Hives, neurologist at [facility] on 06/21/2016 and I reviewed his office notes. He felt that she had nonphysiologic left-sided weakness. She was started on medication for her recurrent headaches. She was started on nortriptyline and is currently also taking Topamax. She reports that her headaches are improved. She does not sleep well. She does not wake up rested. She is unsure if she snores. Her husband sleeps deeply and uses a CPAP machine. She lives at home with her husband and 35 year old stepson as well as 39-year-old daughter. She smokes half pack per day. She reports nocturia at least 3 times per average night.  Her Past Medical History Is Significant For: Past Medical History:  Diagnosis Date  . Anxiety    hx of panic attacks  . Carotid artery stenosis   . Obesity   .  Wrist pain     Her Past Surgical History Is Significant For: Past Surgical History:  Procedure Laterality Date  . APPENDECTOMY    . CHOLECYSTECTOMY  2014   . DILATION AND CURETTAGE OF UTERUS  01/13/2011  . ELBOW SURGERY     right  . FRACTURE SURGERY Right 2008   forearm and elbow  . KNEE ARTHROSCOPY Left   . KNEE SURGERY     left knee  . LAPAROSCOPIC APPENDECTOMY    . LAPAROSCOPIC HYSTERECTOMY Bilateral 01/14/2015   Procedure: HYSTERECTOMY TOTAL LAPAROSCOPIC/BILATERAL SALPINGECTOMY/left ovarian cystectomy;  Surgeon: Honor Loh Ward, MD;  Location: [facility] ORS;  Service: Gynecology;  Laterality: Bilateral;  . MYRINGOTOMY WITH TUBE PLACEMENT  1988  . TONSILLECTOMY      Her Family History Is Significant For: Family History  Problem Relation Age of Onset  . Heart disease Mother        stent placement  . Heart attack Mother 19       MI  . Hypertension Father   . Hyperlipidemia Father     Her Social History Is Significant For: Social History   Social History  . Marital status: Married    Spouse name: N/A  . Number of children: N/A  . Years of education: N/A   Social History Main Topics  . Smoking status: Current Every Day Smoker    Packs/day: 0.50    Years: 5.00    Types: Cigarettes  . Smokeless tobacco: Never Used  . Alcohol use 0.0 - 0.6 oz/week     Comment: 1 glass of wine or cocktail per month  . Drug use: No  . Sexual activity: Yes    Birth control/ protection: Implant   Other Topics Concern  . None   Social History Narrative  . None    Her Allergies Are:  Allergies  Allergen Reactions  . Bee Venom Anaphylaxis  . Penicillins Hives  . Adhesive [Tape] Other (See Comments)    Blisters if left on a prolong period of time. Paper tape is ok  :   Her Current Medications Are:  Outpatient Encounter Prescriptions as of 08/08/2016  Medication Sig  . ALPRAZolam (XANAX) 1 MG tablet Take 1 mg by mouth 2 (two) times daily.  Marland Kitchen atorvastatin (LIPITOR) 40 MG tablet Take 40 mg by mouth daily.  Marland Kitchen buPROPion (WELLBUTRIN) 100 MG tablet Take 100 mg by mouth 2 (two) times daily.  Marland Kitchen dexlansoprazole (DEXILANT) 60 MG capsule Take 60  mg by mouth daily.  . Multiple Vitamin (MULTIVITAMIN) tablet Take 1 tablet by mouth daily.  . nortriptyline (PAMELOR) 25 MG capsule Take 100 mg by mouth at bedtime.  . topiramate (TOPAMAX) 50 MG tablet Take 50 mg by mouth at bedtime.  . Vitamin D, Ergocalciferol, (DRISDOL) 50000 units CAPS capsule once a week.  . [DISCONTINUED] acetaminophen (TYLENOL) 325 MG tablet Take 2 tablets (650 mg total) by mouth every 4 (four) hours as needed.  . [DISCONTINUED] HYDROcodone-acetaminophen (NORCO/VICODIN) 5-325 MG tablet Take 1 tablet by mouth every 4 (four) hours as needed for moderate pain.  . [DISCONTINUED] ibuprofen (ADVIL,MOTRIN) 800 MG tablet Take 1 tablet (800 mg total) by mouth every 8 (eight) hours as needed for moderate pain.  . [DISCONTINUED] meloxicam (MOBIC) 15 MG tablet Take 1 tablet (15 mg total) by mouth daily.  . [DISCONTINUED] nitroGLYCERIN (NITROSTAT) 0.4 MG SL tablet Place 1 tablet (0.4 mg total) under the tongue every 5 (five) minutes as needed for chest pain.  . [DISCONTINUED] traMADol (ULTRAM) 50 MG  tablet Take 1 tablet (50 mg total) by mouth every 6 (six) hours as needed for moderate pain.  . [DISCONTINUED] Vitamin D, Ergocalciferol, (DRISDOL) 50000 units CAPS capsule Take 50,000 Units by mouth every 7 (seven) days.   No facility-administered encounter medications on file as of 08/08/2016.   : Review of Systems:  Out of a complete 14 point review of systems, all are reviewed and negative with the exception of these symptoms as listed below: Review of Systems  Neurological:       Pt presents today to discuss her dizziness, falls, and left sided numbness. Pt reports that she started falling when she changed positions. Now, she will fall at any time, and reports falling while walking to a store yesterday.    Objective:  Neurologic Exam  Physical Exam Physical Examination:   There were no vitals filed for this visit.  On orthostatic vitals testing she had no significant change  between sitting blood pressure and pulse and standing blood pressure and pulse.  General Examination: The patient is a very [place] 36 y.o. female in no acute distress. She appears well-developed and well-nourished and well groomed. She is mildly anxious appearing.  HEENT: Normocephalic, atraumatic, pupils are equal, round and reactive to light and accommodation. Extraocular tracking is good without limitation to gaze excursion or nystagmus noted. Normal smooth pursuit is noted. Hearing is grossly intact. Face is symmetric with normal facial animation. Speech is clear with no dysarthria noted. There is no hypophonia. There is no lip, neck/head, jaw or voice tremor. Neck is supple with full range of passive and active motion. There are no carotid bruits on auscultation. Oropharynx exam reveals: mild mouth dryness, adequate dental hygiene and mild airway crowding, due to smaller airway entry and redundant soft palate, tonsils absent. Mallampati is class I. Neck circumference is 15-7/8 inches. Tongue protrudes centrally and palate elevates symmetrically.   Chest: Clear to auscultation without wheezing, rhonchi or crackles noted.  Heart: S1+S2+0, regular and normal without murmurs, rubs or gallops noted.   Abdomen: Soft, non-tender and non-distended with normal bowel sounds appreciated on auscultation.  Extremities: There is no pitting edema in the distal lower extremities bilaterally. Pedal pulses are intact.  Skin: Warm and dry without trophic changes noted. There are no varicose veins in the distal lower extremities bilaterally.  Musculoskeletal: exam reveals no obvious joint deformities, tenderness or joint swelling or erythema.   Neurologically:  Mental status: The patient is awake, alert and oriented in all 4 spheres. Her immediate and remote memory, attention, language skills and fund of knowledge are appropriate. There is no evidence of aphasia, agnosia, apraxia or anomia. Speech is clear  with normal prosody and enunciation. Thought process is linear. Mood is decreased range and affect is constricted.  Cranial nerves II - XII are as described above under HEENT exam. In addition: shoulder shrug is normal with equal shoulder height noted. Motor exam: Normal bulk, strength and tone is noted. There is no drift, tremor or rebound, with the exception of obvious drift in the left upper extremity, giveaway weakness in the left upper and lower extremities. Romberg is negative, but initially she slumped backwards into her chair. Reflexes are 2+ throughout. Babinski: Toes are flexor bilaterally. Fine motor skills and coordination: intact with normal finger taps, normal hand movements, normal rapid alternating patting, normal foot taps and normal foot agility. , Some inconsistencies and slowness noted on the left. Cerebellar testing: No dysmetria or intention tremor on finger to nose testing, except for  consistent pointing past her nose with the left upper extremity. Heel to shin is unremarkable bilaterally. There is no truncal or gait ataxia.  Sensory exam: intact to light touch, pinprick, vibration, temperature sense in the upper and lower extremities with the exception of patchy decrease in vibration sense in the left lower extremity and pinprick sensation in the left lower extremity. She stands slowly and pushes herself up. She walks with preserved arm swing, obviously intermittently dragging her left foot. Tandem walk is rather difficult for her and she also onto the table.  Assessment and plan:  In summary, Kashia Lantigua is a very [place] 36 y.o.-year old female with an underlying medical history of obesity, hyperlipidemia, anxiety, allergies, prediabetes, vitamin D deficiency, smoking, and recurrent headaches, who presents for another opinion about her balance problems, recurrent falls and intermittent left-sided numbness and weakness. She had workup last year for this when she was in the  hospital and also as an outpatient through your office and also after seeing Dr. Mee Hives. On examination, she has no telltale hemiparesis or hemisensory loss, she has some patchy changes in her sensory exam and some inconsistencies with her motor exam. Functional overlay cannot be excluded.  She had extensive workup as I understand at [facility] but also through your office in the past year or so for this issue.  was not able to review hospital records but did review your extensive office records. She is advised that her most recent carotid Doppler testing your office did not show any significant ICA stenoses. She has also seen a neurologist at your office. I would be happy to look into further workup from my end of things. I will order a brain MRI and neck MRI with and without contrast as well as MRA head and neck. Furthermore, I suggested that we proceed with sleep study testing for concern for underlying obstructive sleep apnea given her obesity, borderline neck size and sleep related complaints including significant nocturia and daytime tiredness.  We will keep her posted as to her test results. She is encouraged to talk to you about potentially seeing a cardiologist as she also reports having had collapse and loss of consciousness. She is also encouraged to talk to you about seeing physical therapy. She is encouraged to start using a cane for safety. If her test results are nonrevealing she can follow-up with me on an as-needed basis. She is strongly encouraged to quit smoking as this would increase her risk for cardiovascular disease. She reports a family history of thoracic aneurysm and one of her brothers as well as brain aneurysm in her other brother.  If she has obstructive sleep apnea she will be encouraged to start treatment for this in the form of CPAP. She's familiar with CPAP therapy as her husband uses a CPAP machine.  I answered all her questions today and she was in agreement with the  plan.   Thank you very much for allowing me to participate in the care of this nice patient. If I can be of any further assistance to you please do not hesitate to call me at [phone].  Sincerely,   Star Age, MD, PhD

## 2016-08-08 NOTE — Patient Instructions (Addendum)
Unfortunately, dizziness is a very common complaint but is often not due to a primary neurological reason or single underlying medical problem. Often, there a combination of factors, that result in dizziness. This includes blood pressure fluctuations, medication side effects, blood sugar fluctuations, stress, vertigo, poor sleep with sleep deprivation, dehydration, and electrolyte disturbance or other metabolic and endocrinological reasons, meaning hormone related problems such as thyroid dysfunction. We will investigate things further with a brain MRI, neck MRI and MRA head and neck. We will call you with the test results. Please talk to your PCP about seeing a cardiologist for possible fainting spells and consider using a cane for safety and consider doing physical therapy.  Based on your symptoms and your exam I believe you are also at risk for obstructive sleep apnea or OSA, and I think we should proceed with a sleep study to determine whether you do or do not have OSA and how severe it is. If you have more than mild OSA, I want you to consider treatment with CPAP. Please remember, the risks and ramifications of moderate to severe obstructive sleep apnea or OSA are: Cardiovascular disease, including congestive heart failure, stroke, difficult to control hypertension, arrhythmias, and even type 2 diabetes has been linked to untreated OSA. Sleep apnea causes disruption of sleep and sleep deprivation in most cases, which, in turn, can cause recurrent headaches, problems with memory, mood, concentration, focus, and vigilance. Most people with untreated sleep apnea report excessive daytime sleepiness, which can affect their ability to drive. Please do not drive if you feel sleepy.   I will likely see you back after your sleep study to go over the test results and where to go from there. We will call you after your sleep study to advise about the results (most likely, you will hear from Beverlee Nims, my nurse) and to set  up an appointment at the time, as necessary.    Our sleep lab administrative assistant, Arrie Aran will meet with you or call you to schedule your sleep study. If you don't hear back from her by next week please feel free to call her at [phone]. This is her direct line and please leave a message with your phone number to call back if you get the voicemail box. She will call back as soon as possible.

## 2016-08-08 NOTE — Telephone Encounter (Signed)
[employer] did not approve the images. It sound's like the MR Brain w/wo & Cervical w/wo is approved both of the MRA's were not approved but since they are all 4 linked together they all will need to be reviewed because of the combination of exams. The phone number for the peer to peer is [phone] and member ID is [id number] & DOB [birth year].

## 2016-08-09 NOTE — Telephone Encounter (Signed)
I personally think you have the magic touch. Thanks Raquel Sarna.

## 2016-08-09 NOTE — Telephone Encounter (Signed)
I called [employer] to withdraw the MRA's. The customer service lady informed me that one of their reviewers reviewed the exams and they all four are approved.Josem Kaufmann: [id number] (exp. 08/08/16 to 09/06/16) I am sending the order to [employer].

## 2016-08-09 NOTE — Telephone Encounter (Signed)
Noted, thank you

## 2016-08-10 ENCOUNTER — Telehealth: Payer: Self-pay | Admitting: Neurology

## 2016-08-10 NOTE — Telephone Encounter (Signed)
Christi from [place] calling to clarify orders for 6-21st  That it is supposed to be without contrast of if Dr Rexene Alberts would like to change it to with contrast(per recommendation of technologist), please call it is for MRA is for neck

## 2016-08-10 NOTE — Telephone Encounter (Signed)
I would like to keep the orders as placed, can you call back?

## 2016-08-11 NOTE — Telephone Encounter (Signed)
I spoke with Alyse Low at [place] and informed her that Dr. Rexene Alberts would like to keep them in the order that they are. Alyse Low said that is okay she was just making sure.

## 2016-08-24 ENCOUNTER — Ambulatory Visit
Admission: RE | Admit: 2016-08-24 | Discharge: 2016-08-24 | Disposition: A | Discharge status: Home / Self Care | Payer: BLUE CROSS/BLUE SHIELD | Source: Ambulatory Visit | Attending: Neurology | Admitting: Neurology

## 2016-08-24 ENCOUNTER — Ambulatory Visit
Admission: RE | Admit: 2016-08-24 | Discharge: 2016-08-24 | Disposition: A | Discharge status: Home / Self Care | Payer: Self-pay | Source: Ambulatory Visit | Attending: Neurology | Admitting: Neurology

## 2016-08-24 ENCOUNTER — Telehealth: Payer: Self-pay | Admitting: Neurology

## 2016-08-24 DIAGNOSIS — R55 Syncope and collapse: Secondary | ICD-10-CM

## 2016-08-24 DIAGNOSIS — R519 Headache, unspecified: Secondary | ICD-10-CM

## 2016-08-24 DIAGNOSIS — R351 Nocturia: Secondary | ICD-10-CM

## 2016-08-24 DIAGNOSIS — R2689 Other abnormalities of gait and mobility: Secondary | ICD-10-CM | POA: Diagnosis not present

## 2016-08-24 DIAGNOSIS — R51 Headache: Secondary | ICD-10-CM

## 2016-08-24 DIAGNOSIS — R42 Dizziness and giddiness: Secondary | ICD-10-CM

## 2016-08-24 DIAGNOSIS — E669 Obesity, unspecified: Secondary | ICD-10-CM

## 2016-08-24 DIAGNOSIS — R296 Repeated falls: Secondary | ICD-10-CM

## 2016-08-24 MED ORDER — GADOBENATE DIMEGLUMINE 529 MG/ML IV SOLN
20.0000 mL | Freq: Once | INTRAVENOUS | Status: AC | PRN
  Administered 2016-08-24: 20 mL via INTRAVENOUS

## 2016-08-24 NOTE — Telephone Encounter (Signed)
Received phone call from [place] at [facility] radiology. She called to confirm the order that was placed for the MRA with Neck was to still be done without contrast even tho the patient will be already receiving contrast with the brain and cervical spine. I spoke with Dr Rexene Alberts and she would like to keep orders as written. Remo Lipps voiced understanding

## 2016-08-24 NOTE — Progress Notes (Signed)
Please call patient re: MRI brain and neck and MRA head and neck:  Brain MRI: The brain scan showed a normal structure of the brain and no volume loss which we call atrophy. There were changes in the deeper structures of the brain, which we call white matter changes or microvascular changes. These were reported as mild in Her case. These are tiny white spots, that occur with time and are seen in a variety of conditions, including with normal aging, chronic hypertension, chronic headaches, especially migraine HAs, chronic diabetes, chronic hyperlipidemia. These are not strokes and no mass or lesion or contrast enhancement was seen which is reassuring. Again, there were no acute findings, such as a stroke, or mass or blood products. Also, stable findings compared to 7/17.   MRI C spine: minimal degenerative changes, no abn signal in cord, no pinched nerve type changes.   Head and neck MRA: no neck artery or intercranial stenoses, so no significant vasc changes noted. Incidental finding of a more tortuous course of the right internal carotid artery. This is unlikely to be clinically significant.  No further action is required on these tests at this time, other than re-enforcing the importance of good blood pressure control, good cholesterol control, good blood sugar control, and weight management.  Please remind patient that she is scheduled for sleep study testing in July and otherwise can FU with PCP.   Monica Age, MD, PhD

## 2016-08-28 ENCOUNTER — Telehealth: Payer: Self-pay | Admitting: *Deleted

## 2016-08-28 NOTE — Telephone Encounter (Signed)
Patient called office returning RN's call.  Please call °

## 2016-08-28 NOTE — Telephone Encounter (Signed)
Spoke to patient - she is aware of results and verbalized understanding. 

## 2016-08-28 NOTE — Telephone Encounter (Signed)
-----   Message from Lester Satartia, RN sent at 08/28/2016 10:26 AM EDT -----   ----- Message ----- From: Star Age, MD Sent: 08/24/2016   6:05 PM To: Lester [place], RN  Please call patient re: MRI brain and neck and MRA head and neck:  Brain MRI: The brain scan showed a normal structure of the brain and no volume loss which we call atrophy. There were changes in the deeper structures of the brain, which we call white matter changes or microvascular changes. These were reported as mild in Her case. These are tiny white spots, that occur with time and are seen in a variety of conditions, including with normal aging, chronic hypertension, chronic headaches, especially migraine HAs, chronic diabetes, chronic hyperlipidemia. These are not strokes and no mass or lesion or contrast enhancement was seen which is reassuring. Again, there were no acute findings, such as a stroke, or mass or blood products. Also, stable findings compared to 7/17.   MRI C spine: minimal degenerative changes, no abn signal in cord, no pinched nerve type changes.   Head and neck MRA: no neck artery or intercranial stenoses, so no significant vasc changes noted. Incidental finding of a more tortuous course of the right internal carotid artery. This is unlikely to be clinically significant.  No further action is required on these tests at this time, other than re-enforcing the importance of good blood pressure control, good cholesterol control, good blood sugar control, and weight management.  Please remind patient that she is scheduled for sleep study testing in July and otherwise can FU with PCP.    Star Age, MD, PhD

## 2016-09-04 ENCOUNTER — Ambulatory Visit (INDEPENDENT_AMBULATORY_CARE_PROVIDER_SITE_OTHER): Payer: BLUE CROSS/BLUE SHIELD | Admitting: Neurology

## 2016-09-04 DIAGNOSIS — G4733 Obstructive sleep apnea (adult) (pediatric): Secondary | ICD-10-CM | POA: Diagnosis not present

## 2016-09-04 DIAGNOSIS — R0683 Snoring: Secondary | ICD-10-CM

## 2016-09-04 DIAGNOSIS — G4720 Circadian rhythm sleep disorder, unspecified type: Secondary | ICD-10-CM

## 2016-09-11 NOTE — Procedures (Signed)
PATIENT'S NAME:  Monica Mann, Monica Mann DOB:      04-Aug-1980      MR#:    [id number]     DATE OF RECORDING: 09/04/2016 REFERRING M.D.:  Doretha Sou, MD Study Performed:   Baseline Polysomnogram HISTORY: 36 year old woman with a history of obesity, hyperlipidemia, anxiety, allergies, prediabetes, vitamin D deficiency, smoking, and recurrent headaches, who reports difficulty with her sleep, including snoring and non-restorative sleep. The patient's weight 258 pounds with a height of 71 (inches), resulting in a BMI of 36.1 kg/m2. The patient's neck circumference measured 15.8 inches.  CURRENT MEDICATIONS: Xanax, Lipitor, Wellbutrin, Dexitant, Multivitamin, Pamelor, Topamax, Drisdol, Tylenol   PROCEDURE:  This is a multichannel digital polysomnogram utilizing the Somnostar 11.2 system.  Electrodes and sensors were applied and monitored per AASM Specifications.   EEG, EOG, Chin and Limb EMG, were sampled at 200 Hz.  ECG, Snore and Nasal Pressure, Thermal Airflow, Respiratory Effort, CPAP Flow and Pressure, Oximetry was sampled at 50 Hz. Digital video and audio were recorded.      BASELINE STUDY  Lights Out was at 21:57 and Lights On at 05:03.  Total recording time (TRT) was 426.5 minutes, with a total sleep time (TST) of  377.5 minutes.   The patient's sleep latency was 34 minutes, which is delayed. REM latency was 180 minutes, which is delayed. The sleep efficiency was 88.5 %.     SLEEP ARCHITECTURE: WASO (Wake after sleep onset) was 5.5 minutes. There were 4 minutes in Stage N1, 287 minutes Stage N2, 49.5 minutes Stage N3 and 37 minutes in Stage REM.  The percentage of Stage N1 was 1.1%, Stage N2 was 76.%, which is increased, Stage N3 was 13.1%, which is mildly reduced and Stage R (REM sleep) was 9.8%, which is reduced.  The arousals were noted as: 37 were spontaneous, 8 were associated with PLMs, 5 were associated with respiratory events.    Audio and video analysis did not show any abnormal or  unusual movements, behaviors, phonations or vocalizations. The patient took no bathroom breaks. Mild intermittent snoring was noted. The EKG was in keeping with normal sinus rhythm (NSR).  RESPIRATORY ANALYSIS:  There were a total of 5 respiratory events:  0 obstructive apneas, 0 central apneas and 0 mixed apneas with a total of 0 apneas and an apnea index (AI) of 0 /hour. There were 5 hypopneas with a hypopnea index of .8 /hour. The patient also had 0 respiratory event related arousals (RERAs).      The total APNEA/HYPOPNEA INDEX (AHI) was .8/hour and the total RESPIRATORY DISTURBANCE INDEX was .8 /hour.  5 events occurred in REM sleep and 0 events in NREM. The REM AHI was 8.1 /hour, versus a non-REM AHI of 0. The patient spent 0 minutes of total sleep time in the supine position and 378 minutes in non-supine.. The supine AHI was n/a versus a non-supine AHI of 0.8.  OXYGEN SATURATION & C02:  The Wake baseline 02 saturation was 95%, with the lowest being 92%. Time spent below 89% saturation equaled 0 minutes.  PERIODIC LIMB MOVEMENTS: The patient had a total of 34 Periodic Limb Movements.  The Periodic Limb Movement (PLM) index was 5.4 and the PLM Arousal index was 1.3/hour.    Post-study, the patient indicated that sleep was the same as usual.   IMPRESSION:  1. Primary Snoring 2. Dysfunctions associated with sleep stages or arousal from sleep  RECOMMENDATIONS:  1. 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 CPAP therapy is not indicated, but weight loss and avoidance of the supine sleep position are recommended. For disturbing snoring, an oral appliance (through a qualified dentist) can be considered.  2. This study shows mildly abnormal sleep stage percentages; these are nonspecific findings and per se do not  signify an intrinsic sleep disorder or a cause for the patient's sleep-related symptoms. Causes include (but are not limited to) the first night effect of the sleep study, circadian rhythm disturbances, medication effect or an underlying mood disorder or medical problem.  3. The patient should be cautioned not to drive, work at heights, or operate dangerous or heavy equipment when tired or sleepy. Review and reiteration of good sleep hygiene measures should be pursued with any patient. 4. The patient can follow-up with her referring provider, who will be notified of the test results.  I certify that I have reviewed the entire raw data recording prior to the issuance of this report in accordance with the Standards of Accreditation of the American Academy of Sleep Medicine (AASM)       Star Age, MD, PhD Diplomat, American Board of Psychiatry and Neurology (Neurology and Sleep Medicine)

## 2016-09-11 NOTE — Progress Notes (Signed)
Patient referred by PCP, seen by me on 08/08/16 for dizziness, diagnostic PSG on 09/04/16.   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 CPAP therapy is not indicated, but weight loss and avoidance of the supine sleep position are recommended. For disturbing snoring, an oral appliance (through a qualified dentist) can be considered.  Please remind patient to try to maintain good sleep hygiene, which means: Keep a regular sleep and wake schedule and make enough time for sleep (7 1/2 to 8 1/2 hours for the average adult), try not to exercise or have a meal within 2 hours of your bedtime, try to keep your bedroom conducive for sleep, that is, cool and dark, without light distractors such as an illuminated alarm clock, and refrain from watching TV right before sleep or in the middle of the night and do not keep the TV or radio on during the night. If a nightlight is used, have it away from the visual field. Also, try not to use or play on electronic devices at bedtime, such as your cell phone, tablet PC or laptop. If you like to read at bedtime on an electronic device, try to dim the background light as much as possible. Do not eat in the middle of the night. Keep pets away from the bedroom environment. For stress relief, try meditation, deep breathing exercises (there are many books and CDs available), a white noise machine or fan can help to diffuse other noise distractors, such as traffic noise. Do not drink alcohol before bedtime, as it can disturb sleep and cause middle of the night awakenings. Never mix alcohol and sedating medications! Avoid narcotic pain medication close to bedtime, as opioids/narcotics can suppress breathing drive and breathing effort.   She can FU with PCP.  Once you have spoken to patient, you can close this encounter.   Thanks,  Star Age, MD, PhD [facility])

## 2016-09-12 ENCOUNTER — Telehealth: Payer: Self-pay

## 2016-09-12 NOTE — Telephone Encounter (Signed)
I called pt to discuss her sleep study results. No answer, left a message asking her to call me back. 

## 2016-09-12 NOTE — Telephone Encounter (Signed)
-----   Message from Star Age, MD sent at 09/11/2016  6:40 PM EDT ----- Patient referred by PCP, seen by me on 08/08/16 for dizziness, diagnostic PSG on 09/04/16.   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 CPAP therapy is not indicated, but weight loss and avoidance of the supine sleep position are recommended. For disturbing snoring, an oral appliance (through a qualified dentist) can be considered.  Please remind patient to try to maintain good sleep hygiene, which means: Keep a regular sleep and wake schedule and make enough time for sleep (7 1/2 to 8 1/2 hours for the average adult), try not to exercise or have a meal within 2 hours of your bedtime, try to keep your bedroom conducive for sleep, that is, cool and dark, without light distractors such as an illuminated alarm clock, and refrain from watching TV right before sleep or in the middle of the night and do not keep the TV or radio on during the night. If a nightlight is used, have it away from the visual field. Also, try not to use or play on electronic devices at bedtime, such as your cell phone, tablet PC or laptop. If you like to read at bedtime on an electronic device, try to dim the background light as much as possible. Do not eat in the middle of the night. Keep pets away from the bedroom environment. For stress relief, try meditation, deep breathing exercises (there are many books and CDs available), a white noise machine or fan can help to diffuse other noise distractors, such as traffic noise. Do not drink alcohol before bedtime, as it can disturb sleep and cause middle of the night awakenings. Never mix alcohol and sedating medications! Avoid narcotic pain medication close to bedtime, as opioids/narcotics can  suppress breathing drive and breathing effort.   She can FU with PCP. Once you have spoken to patient, you can close this encounter.   Thanks,  Star Age, MD, PhD [facility])

## 2016-09-14 NOTE — Telephone Encounter (Addendum)
I called pt. I advised her that her sleep study did not demonstrate any significant obstructive or central sleep disordered breathing with the exception of mild intermittent snoring and mild osa during REM sleep, oxygen desaturation nadir of 93%. Dr. Rexene Alberts says that CPAP therapy is not indicated, but weight loss and avoidance of the supine sleep position are recommended. If pt wants to treat her snoring, an oral appliance through a qualified dentist can be considered.  I reviewed sleep hygiene recommendations with the pt, including trying to keep a regular sleep wake schedule, avoiding electronics in the bedroom, keeping the bedroom cool, dark, and quiet, and avoiding eating or exercising within 2 hours of bedtime as well as eating in the middle of the night. I recommended stress relief, meditation, and deep breathing exercises. I advised pt to avoid sedative-hypnotics which may worsen sleep apnea, alcohol and tobacco. I asked her to never mix alcohol and sedating medications and to avoid narcotics. I advised pt that a copy of these sleep study results will be sent to Dr. Joneen Caraway. Pt verbalized understanding of results. Pt had no questions at this time but was encouraged to call back if questions arise.

## 2016-11-16 DIAGNOSIS — R0609 Other forms of dyspnea: Secondary | ICD-10-CM
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
## 2016-11-17 ENCOUNTER — Ambulatory Visit (HOSPITAL_COMMUNITY)
Admission: RE | Admit: 2016-11-17 | Discharge: 2016-11-17 | Disposition: A | Discharge status: Home / Self Care | Payer: BLUE CROSS/BLUE SHIELD | Source: Ambulatory Visit | Attending: Cardiology | Admitting: Cardiology

## 2016-11-17 ENCOUNTER — Encounter (HOSPITAL_COMMUNITY)
Admission: RE | Disposition: A | Discharge status: Home / Self Care | Payer: Self-pay | Source: Ambulatory Visit | Attending: Cardiology

## 2016-11-17 ENCOUNTER — Encounter (HOSPITAL_COMMUNITY): Payer: Self-pay | Admitting: Cardiology

## 2016-11-17 DIAGNOSIS — Z6836 Body mass index (BMI) 36.0-36.9, adult: Secondary | ICD-10-CM | POA: Insufficient documentation

## 2016-11-17 DIAGNOSIS — Z8249 Family history of ischemic heart disease and other diseases of the circulatory system: Secondary | ICD-10-CM | POA: Insufficient documentation

## 2016-11-17 DIAGNOSIS — R079 Chest pain, unspecified: Secondary | ICD-10-CM | POA: Diagnosis present

## 2016-11-17 DIAGNOSIS — R9439 Abnormal result of other cardiovascular function study: Secondary | ICD-10-CM | POA: Diagnosis not present

## 2016-11-17 DIAGNOSIS — K219 Gastro-esophageal reflux disease without esophagitis: Secondary | ICD-10-CM | POA: Diagnosis not present

## 2016-11-17 DIAGNOSIS — R0609 Other forms of dyspnea: Secondary | ICD-10-CM | POA: Diagnosis present

## 2016-11-17 DIAGNOSIS — Z880 Allergy status to penicillin: Secondary | ICD-10-CM | POA: Insufficient documentation

## 2016-11-17 DIAGNOSIS — Z87891 Personal history of nicotine dependence: Secondary | ICD-10-CM | POA: Insufficient documentation

## 2016-11-17 DIAGNOSIS — R0602 Shortness of breath: Secondary | ICD-10-CM | POA: Diagnosis not present

## 2016-11-17 DIAGNOSIS — F419 Anxiety disorder, unspecified: Secondary | ICD-10-CM | POA: Diagnosis not present

## 2016-11-17 DIAGNOSIS — E7800 Pure hypercholesterolemia, unspecified: Secondary | ICD-10-CM | POA: Diagnosis not present

## 2016-11-17 HISTORY — PX: RIGHT/LEFT HEART CATH AND CORONARY ANGIOGRAPHY: CATH118266

## 2016-11-17 LAB — POCT I-STAT 3, VENOUS BLOOD GAS (G3P V)
Acid-base deficit: 4 mmol/L — ABNORMAL HIGH (ref 0.0–2.0)
Bicarbonate: 21.8 mmol/L (ref 20.0–28.0)
O2 SAT: 69 %
PCO2 VEN: 40.6 mmHg — AB (ref 44.0–60.0)
TCO2: 23 mmol/L (ref 22–32)
pH, Ven: 7.337 (ref 7.250–7.430)
pO2, Ven: 38 mmHg (ref 32.0–45.0)

## 2016-11-17 LAB — POCT I-STAT 3, ART BLOOD GAS (G3+)
ACID-BASE DEFICIT: 3 mmol/L — AB (ref 0.0–2.0)
BICARBONATE: 21.4 mmol/L (ref 20.0–28.0)
O2 SAT: 96 %
PO2 ART: 85 mmHg (ref 83.0–108.0)
TCO2: 23 mmol/L (ref 22–32)
pCO2 arterial: 37 mmHg (ref 32.0–48.0)
pH, Arterial: 7.37 (ref 7.350–7.450)

## 2016-11-17 LAB — POCT ACTIVATED CLOTTING TIME: Activated Clotting Time: 202 seconds

## 2016-11-17 SURGERY — RIGHT/LEFT HEART CATH AND CORONARY ANGIOGRAPHY
Anesthesia: LOCAL

## 2016-11-17 MED ORDER — HYDROMORPHONE HCL 1 MG/ML IJ SOLN
INTRAMUSCULAR | Status: AC
  Filled 2016-11-17: qty 0.5

## 2016-11-17 MED ORDER — HEPARIN (PORCINE) IN NACL 2-0.9 UNIT/ML-% IJ SOLN
INTRAMUSCULAR | Status: AC | PRN
  Administered 2016-11-17: 1000 mL

## 2016-11-17 MED ORDER — IOPAMIDOL (ISOVUE-370) INJECTION 76%
INTRAVENOUS | Status: AC
  Filled 2016-11-17: qty 100

## 2016-11-17 MED ORDER — HEPARIN SODIUM (PORCINE) 1000 UNIT/ML IJ SOLN
INTRAMUSCULAR | Status: DC | PRN
  Administered 2016-11-17: 6000 [IU] via INTRAVENOUS

## 2016-11-17 MED ORDER — SODIUM CHLORIDE 0.9% FLUSH: 3.0000 mL | INTRAVENOUS | Status: DC | PRN

## 2016-11-17 MED ORDER — HEPARIN (PORCINE) IN NACL 2-0.9 UNIT/ML-% IJ SOLN
INTRAMUSCULAR | Status: AC
  Filled 2016-11-17: qty 1000

## 2016-11-17 MED ORDER — SODIUM CHLORIDE 0.9 % IV SOLN: 250.0000 mL | INTRAVENOUS | Status: DC | PRN

## 2016-11-17 MED ORDER — ACETAMINOPHEN 325 MG PO TABS: 650.0000 mg | ORAL_TABLET | ORAL | Status: DC | PRN

## 2016-11-17 MED ORDER — ASPIRIN 81 MG PO CHEW
CHEWABLE_TABLET | ORAL | Status: AC
  Administered 2016-11-17: 81 mg via ORAL
  Filled 2016-11-17: qty 1

## 2016-11-17 MED ORDER — MIDAZOLAM HCL 2 MG/2ML IJ SOLN
INTRAMUSCULAR | Status: DC | PRN
  Administered 2016-11-17: 1 mg via INTRAVENOUS
  Administered 2016-11-17: 2 mg via INTRAVENOUS

## 2016-11-17 MED ORDER — MIDAZOLAM HCL 2 MG/2ML IJ SOLN
INTRAMUSCULAR | Status: AC
  Filled 2016-11-17: qty 2

## 2016-11-17 MED ORDER — HYDROMORPHONE HCL 1 MG/ML IJ SOLN
INTRAMUSCULAR | Status: AC
  Administered 2016-11-17: 0.5 mg via INTRAVENOUS
  Filled 2016-11-17: qty 1

## 2016-11-17 MED ORDER — SODIUM CHLORIDE 0.9 % WEIGHT BASED INFUSION
3.0000 mL/kg/h | INTRAVENOUS | Status: AC
  Administered 2016-11-17: 3 mL/kg/h via INTRAVENOUS

## 2016-11-17 MED ORDER — ONDANSETRON HCL 4 MG/2ML IJ SOLN: 4.0000 mg | Freq: Four times a day (QID) | INTRAMUSCULAR | Status: DC | PRN

## 2016-11-17 MED ORDER — HYDROMORPHONE HCL 1 MG/ML IJ SOLN
INTRAMUSCULAR | Status: DC | PRN
  Administered 2016-11-17: 0.5 mg via INTRAVENOUS

## 2016-11-17 MED ORDER — FENTANYL CITRATE (PF) 100 MCG/2ML IJ SOLN
INTRAMUSCULAR | Status: AC
  Filled 2016-11-17: qty 2

## 2016-11-17 MED ORDER — LIDOCAINE HCL (PF) 1 % IJ SOLN
INTRAMUSCULAR | Status: DC | PRN
  Administered 2016-11-17: 5 mL

## 2016-11-17 MED ORDER — SODIUM CHLORIDE 0.9% FLUSH: 3.0000 mL | Freq: Two times a day (BID) | INTRAVENOUS | Status: DC

## 2016-11-17 MED ORDER — IOPAMIDOL (ISOVUE-370) INJECTION 76%
INTRAVENOUS | Status: DC | PRN
Start: 2016-11-17 — End: 2016-11-17
  Administered 2016-11-17: 40 mL via INTRA_ARTERIAL

## 2016-11-17 MED ORDER — HYDROMORPHONE HCL 1 MG/ML IJ SOLN
0.5000 mg | Freq: Once | INTRAMUSCULAR | Status: AC
  Administered 2016-11-17: 0.5 mg via INTRAVENOUS

## 2016-11-17 MED ORDER — VERAPAMIL HCL 2.5 MG/ML IV SOLN
INTRA_ARTERIAL | Status: DC | PRN
  Administered 2016-11-17: 10 mL via INTRA_ARTERIAL

## 2016-11-17 MED ORDER — VERAPAMIL HCL 2.5 MG/ML IV SOLN
INTRAVENOUS | Status: AC
  Filled 2016-11-17: qty 2

## 2016-11-17 MED ORDER — SODIUM CHLORIDE 0.9 % WEIGHT BASED INFUSION: 1.0000 mL/kg/h | INTRAVENOUS | Status: DC

## 2016-11-17 MED ORDER — LIDOCAINE HCL 2 % IJ SOLN
INTRAMUSCULAR | Status: AC
  Filled 2016-11-17: qty 10

## 2016-11-17 MED ORDER — FENTANYL CITRATE (PF) 100 MCG/2ML IJ SOLN
INTRAMUSCULAR | Status: DC | PRN
  Administered 2016-11-17: 25 ug via INTRAVENOUS

## 2016-11-17 MED ORDER — SODIUM CHLORIDE 0.9 % IV SOLN: INTRAVENOUS | Status: AC

## 2016-11-17 MED ORDER — ASPIRIN 81 MG PO CHEW
81.0000 mg | CHEWABLE_TABLET | ORAL | Status: AC
  Administered 2016-11-17: 81 mg via ORAL

## 2016-11-17 MED ORDER — HEPARIN SODIUM (PORCINE) 1000 UNIT/ML IJ SOLN
INTRAMUSCULAR | Status: AC
  Filled 2016-11-17: qty 1

## 2016-11-17 SURGICAL SUPPLY — 17 items
CATH OPTITORQUE TIG 4.0 5F (CATHETERS) ×2 IMPLANT
CATH SWAN GANZ 7F STRAIGHT (CATHETERS) ×2 IMPLANT
COVER PRB 48X5XTLSCP FOLD TPE (BAG) ×2 IMPLANT
COVER PROBE 5X48 (BAG) ×2
DEVICE RAD COMP TR BAND LRG (VASCULAR PRODUCTS) ×2 IMPLANT
GLIDESHEATH SLEND A-KIT 6F 20G (SHEATH) ×2 IMPLANT
GUIDEWIRE INQWIRE 1.5J.035X260 (WIRE) ×1 IMPLANT
INQWIRE 1.5J .035X260CM (WIRE) ×2
KIT HEART LEFT (KITS) ×2 IMPLANT
KIT MICROINTRODUCER STIFF 5F (SHEATH) ×2 IMPLANT
PACK CARDIAC CATHETERIZATION (CUSTOM PROCEDURE TRAY) ×2 IMPLANT
SHEATH GLIDE SLENDER 4/5FR (SHEATH) ×2 IMPLANT
SHEATH PINNACLE 7F 10CM (SHEATH) ×2 IMPLANT
TRANSDUCER W/STOPCOCK (MISCELLANEOUS) ×2 IMPLANT
TUBING CIL FLEX 10 FLL-RA (TUBING) ×2 IMPLANT
WIRE EMERALD 3MM-J .025X260CM (WIRE) ×2 IMPLANT
WIRE MICROINTRODUCER 60CM (WIRE) ×2 IMPLANT

## 2016-11-17 NOTE — Progress Notes (Signed)
Cath Lab aware that IV team consult was placed for right AC IV due to [place] and no venous access and that pt has 1 IV in left hand only. Instructed to bring pt on over to cath lab.

## 2016-11-17 NOTE — Progress Notes (Signed)
:  Site area: Rt fem vein :Site Prior to Removal:  Level 0 Pressure Applied For: 54min Manual:   yesPatient Status During Pull:   Post Pull Site:  Level 0 Post Pull Instructions Given:  Post intructions given and pt understands.  Post Pull Pulses Present: 2+ rt dp Dressing Applied:  tegaderm and a 2x2 Bedrest begins @ 09:20:00 Comments: Pt leaves cath lab holding area in stable condition. Rt groin and rt radial sites are unremarkable. No problems or complications.

## 2016-11-17 NOTE — Interval H&P Note (Signed)
History and Physical Interval Note:  11/17/2016 7:36 AM  Monica Mann  has presented today for surgery, with the diagnosis of cp - shortness of breath  The various methods of treatment have been discussed with the patient and family. After consideration of risks, benefits and other options for treatment, the patient has consented to  Procedure(s): RIGHT/LEFT HEART CATH AND CORONARY ANGIOGRAPHY (N/A) and possible angioplasty as a surgical intervention .  The patient's history has been reviewed, patient examined, no change in status, stable for surgery.  I have reviewed the patient's chart and labs.  Questions were answered to the patient's satisfaction.   Symptom Status: Ischemic Symptoms Non-invasive Testing: High risk If no or indeterminate stress test, FFR/iFR results in all diseased vessels: N/A Diabetes Mellitus: No S/P CABG: No Antianginal therapy (number of long-acting drugs): 1 Patient undergoing renal transplant: No Patient undergoing percutaneous valve procedure: No   1 Vessel Disease No proximal LAD involvement, No proximal left dominant LCX involvement  PCI: M (6);  Indication 2  CABG: M (4);  Indication 2 Proximal left dominant LCX involvement  PCI: A (7);  Indication 5  CABG: A (7);  Indication 5 Proximal LAD involvement  PCI: A (7);  Indication 5  CABG: A (7);  Indication 5  2 Vessel Disease No proximal LAD involvement  PCI: A (7);  Indication 8  CABG: M (6);  Indication 8 Proximal LAD involvement  PCI: A (7);  Indication 11  CABG: A (7);  Indication 11  3 Vessel Disease Low disease complexity (e.g., focal stenoses, SYNTAX <=22)  PCI: A (7);  Indication 17  CABG: A (8);  Indication 17 Intermediate or high disease complexity (e.g., SYNTAX >=23)  PCI: M (6);  Indication 21  CABG: A (8);  Indication 21  Left Main Disease Isolated LMCA disease: ostial or midshaft  PCI: A (7);  Indication 24  CABG: A (9);  Indication 24 Isolated LMCA disease: bifurcation  involvement  PCI: M (5);  Indication 25  CABG: A (9);  Indication 25 LMCA ostial or midshaft, concurrent low disease burden multivessel disease (e.g., 1-2 additional focal stenoses, SYNTAX <=22)  PCI: A (7);  Indication 26  CABG: A (9);  Indication 26 LMCA ostial or midshaft, concurrent intermediate or high disease burden multivessel disease (e.g., 1-2 additional bifurcation stenoses, long stenoses, SYNTAX >=23)  PCI: M (4);  Indication 27  CABG: A (9);  Indication 27 LMCA bifurcation involvement, concurrent low disease burden multivessel disease (e.g., 1-2 additional focal stenoses, SYNTAX <=22)  PCI: M (5);  Indication 28  CABG: A (9);  Indication 28 LMCA bifurcation involvement, concurrent intermediate or high disease burden multivessel disease (e.g., 1-2 additional bifurcation stenoses, long stenoses, SYNTAX >=23)  PCI: R (3);  Indication 29  CABG: A (9);  Indication 29  Notes:  A indicates appropriate. M indicates may be appropriate. R indicates rarely appropriate. Number in parentheses is median score for that indication. Reclassify indicates number of functionally diseased vessels should be decreased given negative FFR/iFR. Re-evaluate the scenario interpreting any FFR/iFR negative vessel as being not significantly stenosed.  Disease means involved vessel provides flow to a sufficient amount of myocardium to be clinically important.  If FFR testing indicates a vessel is not significant, that vessel should not be considered diseased (and the patient should be reclassified with respect to extent of functionally significant disease).  Proximal LAD + proximal left dominant LCX is considered 3 vessel CAD  2 Vessel CAD with FFR/iFR abnormal in only 1  but not both is considered 1 vessel CAD  Disease complexity includes occlusion, bifurcation, trifurcation, ostial, >20 mm, tortuosity, calcification, thrombus  LMCA disease is >=50% by angiography, MLD <2.8 mm, MLA <6 mm2; MLA 6-7.5 mm2 requires  further physiologic  See Table B for risk stratification based on noninvasive testing  Journal of the [employer] of Cardiology Mar 2017, 23391; DOI: 10.1016/j.jacc.2017.02.001 [email or web address].full-text.pdf This App  2018 by the Society for Cardiovascular Angiography and Interventions   Adrian Prows

## 2016-11-17 NOTE — Discharge Instructions (Signed)
Femoral Site Care °Refer to this sheet in the next few weeks. These instructions provide you with information about caring for yourself after your procedure. Your health care provider may also give you more specific instructions. Your treatment has been planned according to current medical practices, but problems sometimes occur. Call your health care provider if you have any problems or questions after your procedure. °What can I expect after the procedure? °After your procedure, it is typical to have the following: °· Bruising at the site that usually fades within 1-2 weeks. °· Blood collecting in the tissue (hematoma) that may be painful to the touch. It should usually decrease in size and tenderness within 1-2 weeks. ° °Follow these instructions at home: °· Take medicines only as directed by your health care provider. °· You may shower 24-48 hours after the procedure or as directed by your health care provider. Remove the bandage (dressing) and gently wash the site with plain soap and water. Pat the area dry with a clean towel. Do not rub the site, because this may cause bleeding. °· Do not take baths, swim, or use a hot tub until your health care provider approves. °· Check your insertion site every day for redness, swelling, or drainage. °· Do not apply powder or lotion to the site. °· Limit use of stairs to twice a day for the first 2-3 days or as directed by your health care provider. °· Do not squat for the first 2-3 days or as directed by your health care provider. °· Do not lift over 10 lb (4.5 kg) for 5 days after your procedure or as directed by your health care provider. °· Ask your health care provider when it is okay to: °? Return to work or school. °? Resume usual physical activities or sports. °? Resume sexual activity. °· Do not drive home if you are discharged the same day as the procedure. Have someone else drive you. °· You may drive 24 hours after the procedure unless otherwise instructed by  your health care provider. °· Do not operate machinery or power tools for 24 hours after the procedure or as directed by your health care provider. °· If your procedure was done as an outpatient procedure, which means that you went home the same day as your procedure, a responsible adult should be with you for the first 24 hours after you arrive home. °· Keep all follow-up visits as directed by your health care provider. This is important. °Contact a health care provider if: °· You have a fever. °· You have chills. °· You have increased bleeding from the site. Hold pressure on the site. °Get help right away if: °· You have unusual pain at the site. °· You have redness, warmth, or swelling at the site. °· You have drainage (other than a small amount of blood on the dressing) from the site. °· The site is bleeding, and the bleeding does not stop after 30 minutes of holding steady pressure on the site. °· Your leg or foot becomes pale, cool, tingly, or numb. °This information is not intended to replace advice given to you by your health care provider. Make sure you discuss any questions you have with your health care provider. °Document Released: 10/24/2013 Document Revised: 07/29/2015 Document Reviewed: 09/09/2013 °Elsevier Interactive Patient Education © 2018 [employer] ° °Radial Site Care °Refer to this sheet in the next few weeks. These instructions provide you with information about caring for yourself after your procedure. Your health   care provider may also give you more specific instructions. Your treatment has been planned according to current medical practices, but problems sometimes occur. Call your health care provider if you have any problems or questions after your procedure. °What can I expect after the procedure? °After your procedure, it is typical to have the following: °· Bruising at the radial site that usually fades within 1-2 weeks. °· Blood collecting in the tissue (hematoma) that may be  painful to the touch. It should usually decrease in size and tenderness within 1-2 weeks. ° °Follow these instructions at home: °· Take medicines only as directed by your health care provider. °· You may shower 24-48 hours after the procedure or as directed by your health care provider. Remove the bandage (dressing) and gently wash the site with plain soap and water. Pat the area dry with a clean towel. Do not rub the site, because this may cause bleeding. °· Do not take baths, swim, or use a hot tub until your health care provider approves. °· Check your insertion site every day for redness, swelling, or drainage. °· Do not apply powder or lotion to the site. °· Do not flex or bend the affected arm for 24 hours or as directed by your health care provider. °· Do not push or pull heavy objects with the affected arm for 24 hours or as directed by your health care provider. °· Do not lift over 10 lb (4.5 kg) for 5 days after your procedure or as directed by your health care provider. °· Ask your health care provider when it is okay to: °? Return to work or school. °? Resume usual physical activities or sports. °? Resume sexual activity. °· Do not drive home if you are discharged the same day as the procedure. Have someone else drive you. °· You may drive 24 hours after the procedure unless otherwise instructed by your health care provider. °· Do not operate machinery or power tools for 24 hours after the procedure. °· If your procedure was done as an outpatient procedure, which means that you went home the same day as your procedure, a responsible adult should be with you for the first 24 hours after you arrive home. °· Keep all follow-up visits as directed by your health care provider. This is important. °Contact a health care provider if: °· You have a fever. °· You have chills. °· You have increased bleeding from the radial site. Hold pressure on the site. °Get help right away if: °· You have unusual pain at the  radial site. °· You have redness, warmth, or swelling at the radial site. °· You have drainage (other than a small amount of blood on the dressing) from the radial site. °· The radial site is bleeding, and the bleeding does not stop after 30 minutes of holding steady pressure on the site. °· Your arm or hand becomes pale, cool, tingly, or numb. °This information is not intended to replace advice given to you by your health care provider. Make sure you discuss any questions you have with your health care provider. °Document Released: 03/25/2010 Document Revised: 07/29/2015 Document Reviewed: 09/08/2013 °Elsevier Interactive Patient Education © 2018 [employer] ° ° °

## 2016-11-17 NOTE — Progress Notes (Signed)
Dr. Einar Gip returned page. Informed that pt complaining of 6/10 pain from right wrist up to right shoulder area and that all air had been removed from right wrist TR Band. Dr. Einar Gip reported that after heart cath, this is an expected pain, no further orders received. Flavia Shipper, post cath RN also made aware of above phone conversation upon return from lunch.

## 2020-05-31 ENCOUNTER — Other Ambulatory Visit: Payer: Self-pay

## 2020-05-31 ENCOUNTER — Encounter (HOSPITAL_COMMUNITY): Payer: Self-pay | Admitting: Emergency Medicine

## 2020-05-31 ENCOUNTER — Emergency Department (HOSPITAL_COMMUNITY): Payer: BC Managed Care – PPO

## 2020-05-31 ENCOUNTER — Emergency Department (HOSPITAL_COMMUNITY)
Admission: EM | Admit: 2020-05-31 | Discharge: 2020-06-01 | Disposition: A | Discharge status: Home / Self Care | Payer: BC Managed Care – PPO | Attending: Emergency Medicine | Admitting: Emergency Medicine

## 2020-05-31 DIAGNOSIS — Y9301 Activity, walking, marching and hiking: Secondary | ICD-10-CM | POA: Insufficient documentation

## 2020-05-31 DIAGNOSIS — S42031G Displaced fracture of lateral end of right clavicle, subsequent encounter for fracture with delayed healing: Secondary | ICD-10-CM | POA: Diagnosis not present

## 2020-05-31 DIAGNOSIS — F17210 Nicotine dependence, cigarettes, uncomplicated: Secondary | ICD-10-CM | POA: Insufficient documentation

## 2020-05-31 DIAGNOSIS — Z7982 Long term (current) use of aspirin: Secondary | ICD-10-CM | POA: Diagnosis not present

## 2020-05-31 DIAGNOSIS — Z79899 Other long term (current) drug therapy: Secondary | ICD-10-CM | POA: Diagnosis not present

## 2020-05-31 DIAGNOSIS — S4991XD Unspecified injury of right shoulder and upper arm, subsequent encounter: Secondary | ICD-10-CM | POA: Diagnosis present

## 2020-05-31 DIAGNOSIS — W01198D Fall on same level from slipping, tripping and stumbling with subsequent striking against other object, subsequent encounter: Secondary | ICD-10-CM | POA: Diagnosis not present

## 2020-05-31 DIAGNOSIS — W19XXXA Unspecified fall, initial encounter: Secondary | ICD-10-CM

## 2020-05-31 NOTE — ED Provider Notes (Signed)
[facility] EMERGENCY DEPARTMENT Provider Note   CSN: [id number] Arrival date & time: 05/31/20  1543     History Chief Complaint  Patient presents with  . Clavicle Injury    Monica Mann is a 40 y.o. female with history of critical illness myopathy, anxiety, carotid artery stenosis who presents the emergency department with a chief complaint of fall.  The patient reports that she was walking down the hallway earlier tonight when she tripped and hit her right shoulder against the wall.  Pain is worse with movement of the right shoulder.  No other known aggravating or alleviating factors.  She characterizes the pain as sharp and throbbing.  She previously fractured her right clavicle on 2/14 and another injury and has been followed by orthopedics at [facility].  She is currently followed by pain management and attempted to take her home tramadol for worsening pain after she hit her right shoulder earlier today.  No improvement with her home medications.  She denies worse than baseline numbness, weakness, neck pain, chest pain, shortness of breath.  She did not fall to the ground.  She denies hitting her head, LOC, nausea, or vomiting.    The history is provided by the patient and medical records. No language interpreter was used.       Past Medical History:  Diagnosis Date  . Anxiety    hx of panic attacks  . Carotid artery stenosis   . Obesity   . Wrist pain     Patient Active Problem List   Diagnosis Date Noted  . Dyspnea on exertion 11/16/2016  . Female pelvic inflammatory disease 12/08/2014  . Pelvic inflammatory disease 12/08/2014  . Chest pain 12/16/2012  . Family history of heart disease 12/16/2012  . Obesity 12/16/2012    Past Surgical History:  Procedure Laterality Date  . APPENDECTOMY    . CHOLECYSTECTOMY  2014  . DILATION AND CURETTAGE OF UTERUS  01/13/2011  . ELBOW SURGERY     right  . FRACTURE SURGERY Right 2008   forearm and elbow  .  KNEE ARTHROSCOPY Left   . KNEE SURGERY     left knee  . LAPAROSCOPIC APPENDECTOMY    . LAPAROSCOPIC HYSTERECTOMY Bilateral 01/14/2015   Procedure: HYSTERECTOMY TOTAL LAPAROSCOPIC/BILATERAL SALPINGECTOMY/left ovarian cystectomy;  Surgeon: Honor Loh Ward, MD;  Location: [facility] ORS;  Service: Gynecology;  Laterality: Bilateral;  . MYRINGOTOMY WITH TUBE PLACEMENT  1988  . RIGHT/LEFT HEART CATH AND CORONARY ANGIOGRAPHY N/A 11/17/2016   Procedure: RIGHT/LEFT HEART CATH AND CORONARY ANGIOGRAPHY;  Surgeon: Nigel Mormon, MD;  Location: [facility];  Service: Cardiovascular;  Laterality: N/A;  . TONSILLECTOMY       OB History   No obstetric history on file.     Family History  Problem Relation Age of Onset  . Heart disease Mother        stent placement  . Heart attack Mother 77       MI  . Hypertension Father   . Hyperlipidemia Father     Social History   Tobacco Use  . Smoking status: Current Every Day Smoker    Packs/day: 0.50    Years: 5.00    Pack years: 2.50    Types: Cigarettes  . Smokeless tobacco: Never Used  Substance Use Topics  . Alcohol use: Yes    Alcohol/week: 0.0 - 1.0 standard drinks    Comment: 1 glass of wine or cocktail per month  . Drug use: No  Home Medications Prior to Admission medications   Medication Sig Start Date End Date Taking? Authorizing Provider  ALPRAZolam Duanne Moron) 1 MG tablet Take 1 mg by mouth 2 (two) times daily.    [provider]  aspirin EC 81 MG tablet Take 81 mg by mouth daily.    [provider]  atorvastatin (LIPITOR) 40 MG tablet Take 40 mg by mouth daily. 08/01/16   [provider]  buPROPion (WELLBUTRIN) 100 MG tablet Take 100 mg by mouth 2 (two) times daily. 07/12/16   [provider]  dexlansoprazole (DEXILANT) 60 MG capsule Take 60 mg by mouth daily.    [provider]  Multiple Vitamin (MULTIVITAMIN) tablet Take 1 tablet by mouth daily.    [provider]   nitroGLYCERIN (NITROSTAT) 0.4 MG SL tablet Place 0.4 mg under the tongue every 5 (five) minutes as needed for chest pain.  11/02/16   [provider]  nortriptyline (PAMELOR) 25 MG capsule Take 100 mg by mouth at bedtime.    [provider]  topiramate (TOPAMAX) 50 MG tablet Take 50 mg by mouth at bedtime.    [provider]  Vitamin D, Ergocalciferol, (DRISDOL) 50000 units CAPS capsule Take 50,000 Units by mouth every Sunday.  07/08/16   [provider]    Allergies    Bee venom, Penicillins, and Adhesive [tape]  Review of Systems   Review of Systems  Constitutional: Negative for activity change.  Eyes: Negative for visual disturbance.  Respiratory: Negative for shortness of breath.   Cardiovascular: Negative for chest pain.  Gastrointestinal: Negative for abdominal pain, diarrhea, nausea and vomiting.  Musculoskeletal: Positive for arthralgias and myalgias. Negative for back pain, neck pain and neck stiffness.  Skin: Negative for rash and wound.  Neurological: Negative for weakness, numbness and headaches.    Physical Exam Updated Vital Signs BP 122/83 (BP Location: Left Arm)   Pulse 79   Temp 98 F (36.7 C) (Oral)   Resp 16   LMP 12/21/2014   SpO2 98%   Physical Exam Vitals and nursing note reviewed.  Constitutional:      General: She is not in acute distress. HENT:     Head: Normocephalic.     Comments: Atraumatic Eyes:     Conjunctiva/sclera: Conjunctivae normal.  Cardiovascular:     Rate and Rhythm: Normal rate and regular rhythm.     Heart sounds: No murmur heard. No friction rub. No gallop.   Pulmonary:     Effort: Pulmonary effort is normal. No respiratory distress.  Chest:     Chest wall: No tenderness.  Abdominal:     General: There is no distension.     Palpations: Abdomen is soft.     Tenderness: There is no abdominal tenderness.  Musculoskeletal:        General: Tenderness present.     Cervical back: Neck supple.      Comments: Shoulder immobilizer in place.  Tender to palpation diffusely to the distal clavicle.  Step-off is noted.  No tenting of the skin.  Spine is nontender.  Radial pulses are 2+ and symmetric.  Sens sensation is intact and at baseline given the patient's history of myopathy.  Good grip strength bilaterally.  No tenderness to palpation to the right elbow or wrist.  Skin:    General: Skin is warm.     Findings: No rash.  Neurological:     Mental Status: She is alert.  Psychiatric:  Behavior: Behavior normal.     ED Results / Procedures / Treatments   Labs (all labs ordered are listed, but only abnormal results are displayed) Labs Reviewed - No data to display  EKG None  Radiology DG Clavicle Right  Result Date: 05/31/2020 CLINICAL DATA:  Pain, fracture clavicle. EXAM: RIGHT CLAVICLE - 2+ VIEWS COMPARISON:  None FINDINGS: Comminuted distal clavicular fracture with complete displacement and mild over riding approximately 1.2 cm over riding of the distal fracture fragment. No additional fractures. Angulation of the oblique fracture is it passes through the distal third of the clavicle with inferior displacement of fracture fragments. IMPRESSION: Comminuted, overriding and displaced fracture of the distal RIGHT clavicle. Electronically Signed   By: Zetta Bills M.D.   On: 05/31/2020 17:24    Procedures Procedures   Medications Ordered in ED Medications  ondansetron (ZOFRAN-ODT) 4 MG disintegrating tablet (has no administration in time range)  morphine 2 MG/ML injection (has no administration in time range)  ondansetron (ZOFRAN-ODT) disintegrating tablet 4 mg (4 mg Oral Given 06/01/20 0155)  morphine 2 MG/ML injection 2 mg (2 mg Intramuscular Given 06/01/20 0155)    ED Course  I have reviewed the triage vital signs and the nursing notes.  Pertinent labs & imaging results that were available during my care of the patient were reviewed by me and considered in my medical  decision making (see chart for details).    MDM Rules/Calculators/A&P                          40 year old female with history of critical illness myopathy, anxiety, carotid artery stenosis presents the emergency department with a chief complaint of fall.  The patient has a known distal right clavicle fracture and is being followed by orthopedics at [facility].  She fell against the wall today and hit her right shoulder on the wall.  She did not hit her head or have any loss of consciousness.  She is on Eliquis, but does not have any evidence of head trauma on her exam.  Doubt intracranial bleed no indication for head imaging at this time.  She has neurovascularly intact.  Vital signs are stable.  Her primary concern is pain control.  Imaging has been reviewed and independently interpreted by me.  Right clavicle x-ray with comminuted, overriding and displaced fracture of the distal right clavicle.  I discussed X-ray findings with Dr. Roxanne Mins, attending physician.  Patient is already established with orthopedics and will need to continue to follow-up.  No indication for emergent orthopedics consult tonight.  Patient was given morphine, and unfortunately eloped from the emergency department prior to re-evaluating the patient.  Final Clinical Impression(s) / ED Diagnoses Final diagnoses:  Fall, initial encounter  Closed displaced fracture of acromial end of right clavicle with delayed healing, subsequent encounter    Rx / DC Orders ED Discharge Orders    None       Hosea Hanawalt A, PA-C 29/79/89 [id number]    Delora Fuel, MD 41/74/08 [phone]

## 2020-05-31 NOTE — ED Triage Notes (Signed)
Pt. Stated, I broke my collar bone  Weeks ago and I ran into the wall today and have been in pain since it happened.

## 2020-06-01 MED ORDER — MORPHINE SULFATE (PF) 2 MG/ML IV SOLN
INTRAVENOUS | Status: AC
  Filled 2020-06-01: qty 1

## 2020-06-01 MED ORDER — MORPHINE SULFATE (PF) 2 MG/ML IV SOLN
2.0000 mg | Freq: Once | INTRAVENOUS | Status: AC
  Administered 2020-06-01: 2 mg via INTRAMUSCULAR

## 2020-06-01 MED ORDER — ONDANSETRON 4 MG PO TBDP
4.0000 mg | ORAL_TABLET | Freq: Once | ORAL | Status: AC
  Administered 2020-06-01: 4 mg via ORAL

## 2020-06-01 MED ORDER — ONDANSETRON 4 MG PO TBDP
ORAL_TABLET | ORAL | Status: AC
  Filled 2020-06-01: qty 1

## 2020-06-01 NOTE — ED Notes (Addendum)
Pt ambulatory to desk, stats she cannot wait for re-eval, states that it is not going to do any good anyway. Pt ambulatory to waiting room, NAD noted.

## 2020-10-04 DEATH — deceased

## 2021-10-27 IMAGING — DX DG CLAVICLE*R*
2 series · 2 of 2 positions shown · non-contrast
Comparison: None

CLINICAL DATA: Pain, fracture clavicle.

EXAM:
RIGHT CLAVICLE - 2+ VIEWS

[clavicle ap]
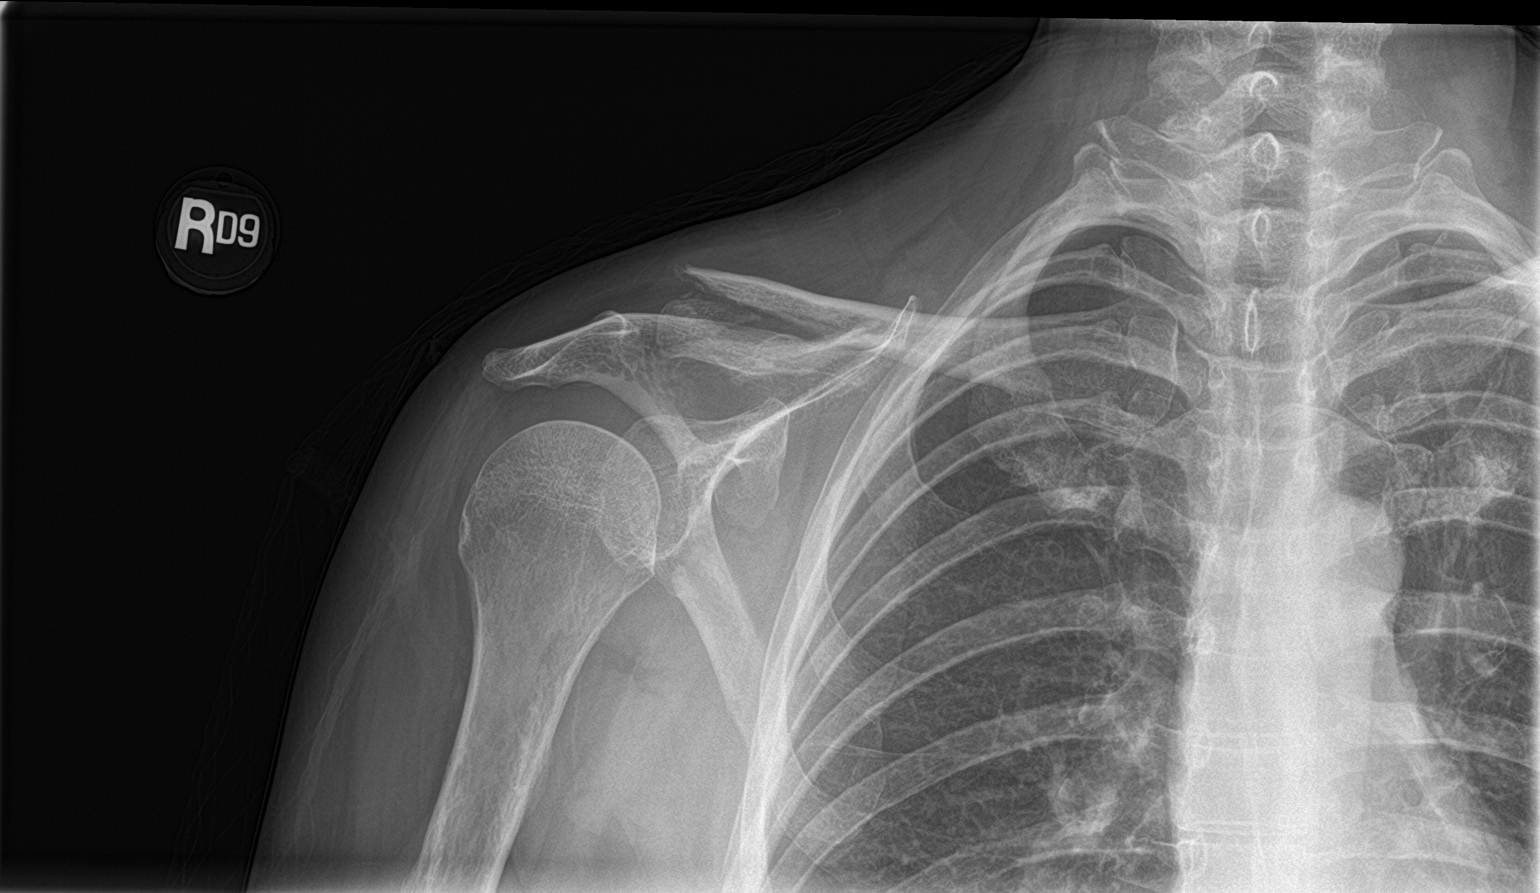

[clavicle axial]
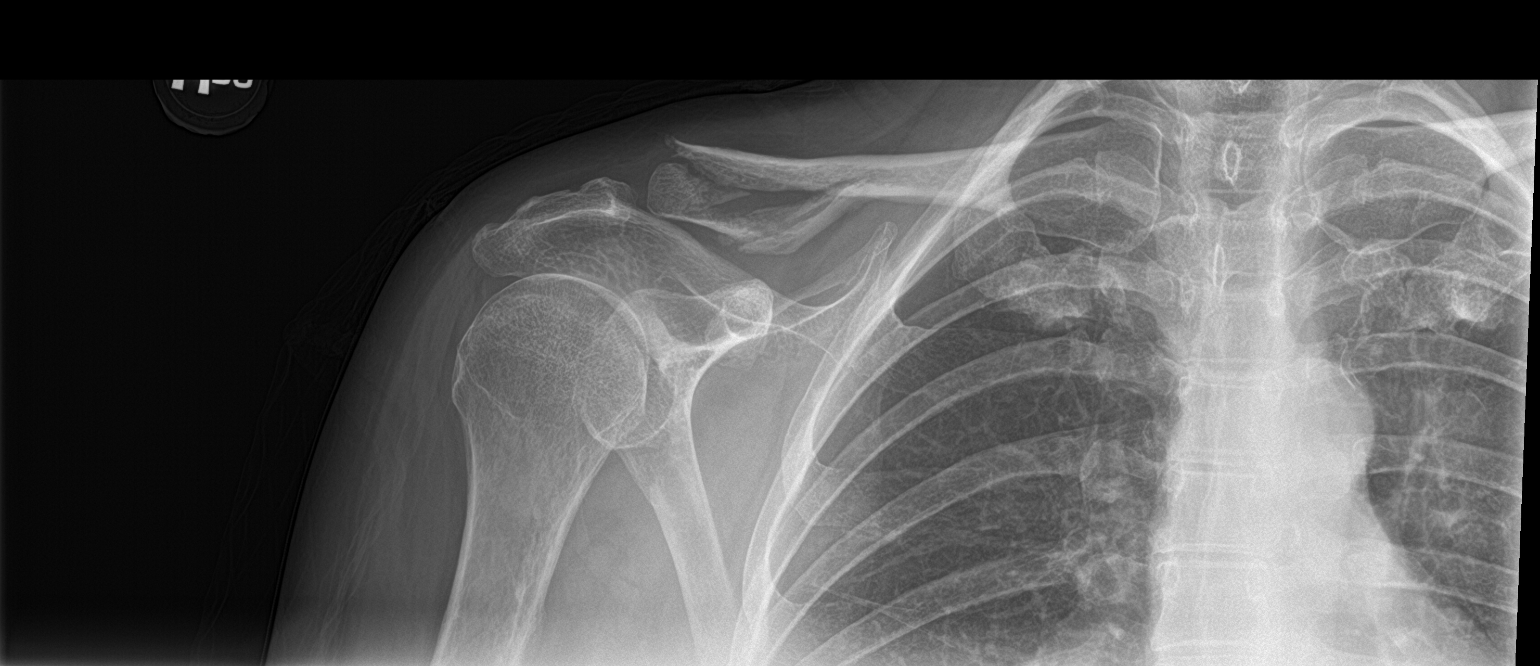

[2 of 2 positions shown; findings below may reference images not displayed]

FINDINGS: Comminuted distal clavicular fracture with complete displacement and
mild over riding approximately 1.2 cm over riding of the distal
fracture fragment. No additional fractures. Angulation of the
oblique fracture is it passes through the distal third of the
clavicle with inferior displacement of fracture fragments.
IMPRESSION: Comminuted, overriding and displaced fracture of the distal RIGHT
clavicle.
